# Patient Record
Sex: Female | Born: 1945
Health system: Southern US, Community
[De-identification: ages and names within clinical notes are randomized; demographics above are authoritative.]

## PROBLEM LIST (undated history)

## (undated) DIAGNOSIS — K5790 Diverticulosis of intestine, part unspecified, without perforation or abscess without bleeding: Secondary | ICD-10-CM

## (undated) DIAGNOSIS — R011 Cardiac murmur, unspecified: Secondary | ICD-10-CM

## (undated) DIAGNOSIS — D649 Anemia, unspecified: Secondary | ICD-10-CM

## (undated) DIAGNOSIS — T7840XA Allergy, unspecified, initial encounter: Secondary | ICD-10-CM

## (undated) DIAGNOSIS — H409 Unspecified glaucoma: Secondary | ICD-10-CM

## (undated) DIAGNOSIS — K635 Polyp of colon: Secondary | ICD-10-CM

## (undated) DIAGNOSIS — I10 Essential (primary) hypertension: Secondary | ICD-10-CM

## (undated) DIAGNOSIS — E785 Hyperlipidemia, unspecified: Secondary | ICD-10-CM

## (undated) HISTORY — DX: Cardiac murmur, unspecified: R01.1

## (undated) HISTORY — DX: Polyp of colon: K63.5

## (undated) HISTORY — DX: Essential (primary) hypertension: I10

## (undated) HISTORY — DX: Unspecified glaucoma: H40.9

## (undated) HISTORY — DX: Allergy, unspecified, initial encounter: T78.40XA

## (undated) HISTORY — DX: Diverticulosis of intestine, part unspecified, without perforation or abscess without bleeding: K57.90

## (undated) HISTORY — DX: Anemia, unspecified: D64.9

## (undated) HISTORY — PX: BREAST CYST ASPIRATION: SHX578

## (undated) HISTORY — DX: Hyperlipidemia, unspecified: E78.5

---

## 1999-01-06 ENCOUNTER — Other Ambulatory Visit: Admission: RE | Admit: 1999-01-06 | Discharge: 1999-01-06 | Payer: Self-pay

## 2000-03-28 ENCOUNTER — Other Ambulatory Visit: Admission: RE | Admit: 2000-03-28 | Discharge: 2000-03-28 | Payer: Self-pay | Admitting: Family Medicine

## 2001-04-24 ENCOUNTER — Other Ambulatory Visit: Admission: RE | Admit: 2001-04-24 | Discharge: 2001-04-24 | Payer: Self-pay | Admitting: Family Medicine

## 2001-08-03 ENCOUNTER — Ambulatory Visit (HOSPITAL_COMMUNITY): Admission: RE | Admit: 2001-08-03 | Discharge: 2001-08-03 | Payer: Self-pay | Admitting: Family Medicine

## 2001-08-03 ENCOUNTER — Encounter (INDEPENDENT_AMBULATORY_CARE_PROVIDER_SITE_OTHER): Payer: Self-pay | Admitting: Specialist

## 2002-05-21 ENCOUNTER — Other Ambulatory Visit: Admission: RE | Admit: 2002-05-21 | Discharge: 2002-05-21 | Payer: Self-pay | Admitting: Family Medicine

## 2003-05-29 ENCOUNTER — Other Ambulatory Visit: Admission: RE | Admit: 2003-05-29 | Discharge: 2003-05-29 | Payer: Self-pay | Admitting: Family Medicine

## 2004-04-23 ENCOUNTER — Other Ambulatory Visit: Admission: RE | Admit: 2004-04-23 | Discharge: 2004-04-23 | Payer: Self-pay | Admitting: Orthopaedic Surgery

## 2004-08-24 ENCOUNTER — Other Ambulatory Visit: Admission: RE | Admit: 2004-08-24 | Discharge: 2004-08-24 | Payer: Self-pay | Admitting: *Deleted

## 2005-08-15 ENCOUNTER — Ambulatory Visit (HOSPITAL_COMMUNITY): Admission: RE | Admit: 2005-08-15 | Discharge: 2005-08-15 | Payer: Self-pay | Admitting: Gastroenterology

## 2006-02-07 ENCOUNTER — Other Ambulatory Visit: Admission: RE | Admit: 2006-02-07 | Discharge: 2006-02-07 | Payer: Self-pay | Admitting: Family Medicine

## 2009-01-14 ENCOUNTER — Ambulatory Visit: Payer: Self-pay | Admitting: Cardiovascular Disease

## 2009-01-14 ENCOUNTER — Encounter: Payer: Self-pay | Admitting: Cardiovascular Disease

## 2009-01-14 DIAGNOSIS — I10 Essential (primary) hypertension: Secondary | ICD-10-CM | POA: Insufficient documentation

## 2009-01-14 DIAGNOSIS — R079 Chest pain, unspecified: Secondary | ICD-10-CM | POA: Insufficient documentation

## 2009-01-14 DIAGNOSIS — R011 Cardiac murmur, unspecified: Secondary | ICD-10-CM | POA: Insufficient documentation

## 2009-01-14 DIAGNOSIS — R9431 Abnormal electrocardiogram [ECG] [EKG]: Secondary | ICD-10-CM | POA: Insufficient documentation

## 2009-01-14 DIAGNOSIS — E782 Mixed hyperlipidemia: Secondary | ICD-10-CM | POA: Insufficient documentation

## 2009-02-02 ENCOUNTER — Telehealth (INDEPENDENT_AMBULATORY_CARE_PROVIDER_SITE_OTHER): Payer: Self-pay

## 2009-02-03 ENCOUNTER — Encounter: Payer: Self-pay | Admitting: Cardiovascular Disease

## 2009-02-03 ENCOUNTER — Encounter: Payer: Self-pay | Admitting: Cardiology

## 2009-02-03 ENCOUNTER — Ambulatory Visit: Payer: Self-pay

## 2009-02-13 ENCOUNTER — Telehealth: Payer: Self-pay | Admitting: Cardiology

## 2011-03-11 NOTE — Procedures (Signed)
[facility]  Patient:    Ariana Dillon, Ariana Dillon Visit Number: [id number] MRN: [id number]          Service Type: END Location: ENDO Attending Physician:  Bosie Clos Proc. Date: 08/03/01 Admit Date:  08/03/2001   CC:         Montey Hora, M.D.   Procedure Report  PROCEDURE:  Esophagogastroduodenoscopy.  INDICATION FOR PROCEDURE:  Gastroesophageal reflux symptoms not adequately responding to proton pump inhibitor.  DESCRIPTION OF PROCEDURE:  The patient was placed in the left lateral decubitus position and placed on the pulse monitor with continuous low-flow oxygen delivered by nasal cannula.  She was sedated with 70 mg IV Demerol and 6 mg IV Versed.  The Olympus video endoscope was advanced under direct vision into the oropharynx and esophagus.  The esophagus was straight and of normal caliber with the squamocolumnar line sharply outlined at 38 cm.  There was no visible hiatal hernia, ring, stricture or other abnormality of the GE junction.  The stomach was entered, and a small amount of liquid secretions were suctioned from the fundus.  Retroflexed view of the cardia was unremarkable.  The fundus, body, antrum, and pylorus all appeared normal.  The duodenum was entered, and both the bulb and second portion were well-inspected and appeared to be within normal limits.  The scope was then withdrawn, and the patient returned to the recovery room in stable condition.  She tolerated the procedure well, and there were no immediate complications.  IMPRESSION:  Essentially normal endoscopy.  PLAN:  Reinforce dietary and lifestyle modifications for reflux and continue proton pump inhibitor.  We will proceed with screening colonoscopy as scheduled. Attending Physician:  Bosie Clos DD:  08/03/01 TD:  08/04/01 Job: [id number]

## 2011-03-11 NOTE — Op Note (Signed)
Ariana Dillon, WESTER NO.:  [id number]   MEDICAL RECORD NO.:  [id number]          PATIENT TYPE:  AMB   LOCATION:  ENDO                         FACILITY:  [facility]   PHYSICIAN:  John C. Madilyn Fireman, M.D.    DATE OF BIRTH:  1946/06/26   DATE OF PROCEDURE:  08/15/2005  DATE OF DISCHARGE:                                 OPERATIVE REPORT   PROCEDURE:  Colonoscopy.   INDICATIONS FOR PROCEDURE:  History of adenomatous colon polyps 4 years ago.   PROCEDURE:  The patient was placed in the left lateral decubitus position  and placed on the pulse monitor with continuous low-flow oxygen delivered by  nasal cannula.  She was sedated with 100 mcg IV fentanyl and 90 mg IV  Versed.  The Olympus video colonoscope was inserted into the rectum and  advanced the cecum, confirmed by transillumination of McBurney's point and  visualization of the ileocecal valve and appendiceal orifice.  The prep was  excellent.  The cecum, ascending, and transverse colon all appeared normal  with no masses, polyps, diverticula, or other mucosal abnormalities.  Within  the descending and sigmoid colon, there were seen numerous scattered  diverticuli.  No other abnormalities.  The scope was then withdrawn, and the  patient returned to the recovery room in stable condition.  She tolerated  the procedure well, and there were no immediate complications.   IMPRESSION:  1.  Diverticulosis.  2.  Otherwise normal study.   PLAN:  Repeat colonoscopy in 5 years.           ______________________________  Everardo All Madilyn Fireman, M.D.     [facility]MED[facility]  D:  08/15/2005  T:  08/15/2005  Job:  [id number]   cc:   Magnus Sinning. Rice, M.D.  Fax: [phone]

## 2011-03-11 NOTE — Procedures (Signed)
[facility]  Patient:    Ariana Dillon, Ariana Dillon Visit Number: [id number] MRN: [id number]          Service Type: END Location: ENDO Attending Physician:  Bosie Clos Proc. Date: 08/03/01 Admit Date:  08/03/2001   CC:         Montey Hora, M.D.   Procedure Report  PROCEDURE:  Colonoscopy.  INDICATION FOR PROCEDURE:  Screening colonoscopy in a patient following esophagogastroduodenoscopy for refractory reflux symptoms.  DESCRIPTION OF PROCEDURE:  The patient was placed in the left lateral decubitus position and placed on the pulse monitor with continuous low-flow oxygen delivered by nasal cannula.  She was sedated with 20 mg IV Demerol and 1.5 mg IV Versed in addition to the medicine given for the previous EGD.  The Olympus video colonoscope was inserted into the rectum and advanced to the cecum, confirmed by transillumination at McBurneys point and visualization of the ileocecal valve and appendiceal orifice.  The prep was excellent.  The cecum revealed a 4 mm polyp and a 7 mm polyp, both of which were fulgurated by hot biopsy.  The remainder of the cecum, ascending, and transverse colon, appeared normal with no further polyps, masses, diverticula, or other mucosal abnormalities.  Within the sigmoid colon, there were seen several diverticula and no other abnormalities.  The rectum appeared normal, and retroflex view of the anus no obvious internal hemorrhoids.  The colonoscope was then withdrawn and the patient returned to the recovery room in stable condition.  She tolerated the procedure well, and there were no immediate complications.  IMPRESSION: 1. Small cecal polyps. 2. Significant diverticulosis. Attending Physician:  Bosie Clos DD:  08/03/01 TD:  08/04/01 Job: [id number]

## 2013-04-30 ENCOUNTER — Ambulatory Visit (INDEPENDENT_AMBULATORY_CARE_PROVIDER_SITE_OTHER): Payer: PRIVATE HEALTH INSURANCE | Admitting: Family Medicine

## 2013-04-30 VITALS — BP 154/67 | HR 76 | Temp 98.4°F | Ht 65.0 in | Wt 184.6 lb

## 2013-04-30 DIAGNOSIS — W57XXXA Bitten or stung by nonvenomous insect and other nonvenomous arthropods, initial encounter: Secondary | ICD-10-CM

## 2013-04-30 DIAGNOSIS — T148 Other injury of unspecified body region: Secondary | ICD-10-CM

## 2013-04-30 MED ORDER — DOXYCYCLINE HYCLATE 100 MG PO TABS: 100.0000 mg | ORAL_TABLET | Freq: Two times a day (BID) | ORAL | Status: DC

## 2013-04-30 NOTE — Progress Notes (Signed)
  Subjective:    Patient ID: Ariana Dillon, female    DOB: 19-Feb-1946, 67 y.o.   MRN: [id number]  HPI  This 67 y.o. female presents for evaluation of tick bite and rash. Patient has rash on her right inner thigh.  She has had this for a couple days..  Review of Systems    C/o rash and tick bite.  No chest pain, SOB, HA, dizziness, vision change, N/V, diarrhea, constipation, dysuria, urinary urgency or frequency, myalgias, arthralgias.  Objective:   Physical Exam  Vital signs noted  Well developed well nourished female.  HEENT - Head atraumatic Normocephalic             Respiratory - Lungs CTA bilateral Cardiac - RRR S1 and S2 without murmur Neuro - Grossly intact.      Assessment & Plan:  Tick bite - Plan: doxycycline (VIBRA-TABS) 100 MG tablet,  Tylenol and motrin otc prn and follow up if not better.

## 2013-04-30 NOTE — Patient Instructions (Signed)
Deer Tick Bite Deer ticks are brown arachnids (spider family) that vary in size from as small as the head of a pin to 1/4 inch (1/2 cm) diameter. They thrive in wooded areas. Deer are the preferred host of adult deer ticks. Small rodents are the host of young ticks (nymphs). When a person walks in a field or wooded area, young and adult ticks in the surrounding grass and vegetation can attach themselves to the skin. They can suck blood for hours to days if unnoticed. Ticks are found all over the [place] Some ticks carry a specific bacteria (Borrelia burgdorferi) that causes an infection called Lyme disease. The bacteria is typically passed into a person during the blood sucking process. This happens after the tick has been attached for at least a number of hours. While ticks can be found all over the [place] those carrying the bacteria that causes Lyme disease are most common in [place] and the [place]. Only a small proportion of ticks in these areas carry the Lyme disease bacteria and cause human infections. Ticks usually attach to warm spots on the body, such as the:  Head.  Back.  Neck.  Armpits.  Groin. SYMPTOMS  Most of the time, a deer tick bite will not be felt. You may or may not see the attached tick. You may notice mild irritation or redness around the bite site. If the deer tick passes the Lyme disease bacteria to a person, a round, red rash may be noticed 2 to 3 days after the bite. The rash may be clear in the middle, like a bull's-eye or target. If not treated, other symptoms may develop several days to weeks after the onset of the rash. These symptoms may include:  New rash lesions.  Fatigue and weakness.  General ill feeling and achiness.  Chills.  Headache and neck pain.  Swollen lymph glands.  Sore muscles and joints. 5 to 15% of untreated people with Lyme disease may develop more severe illnesses after several weeks to months. This may include inflammation of the  brain lining (meningitis), nerve palsies, an abnormal heartbeat, or severe muscle and joint pain and inflammation (myositis or arthritis). DIAGNOSIS   Physical exam and medical history.  Viewing the tick if it was saved for confirmation.  Blood tests (to check or confirm the presence of Lyme disease). TREATMENT  Most ticks do not carry disease. If found, an attached tick should be removed using tweezers. Tweezers should be placed under the body of the tick so it is removed by its attachment parts (pincers). If there are signs or symptoms of being sick, or Lyme disease is confirmed, medicines (antibiotics) that kill germs are usually prescribed. In more severe cases, antibiotics may be given through an intravenous (IV) access. HOME CARE INSTRUCTIONS   Always remove ticks with tweezers. Do not use petroleum jelly or other methods to kill or remove the tick. Slide the tweezers under the body and pull out as much as you can. If you are not sure what it is, save it in a jar and show your caregiver.  Once you remove the tick, the skin will heal on its own. Wash your hands and the affected area with water and soap. You may place a bandage on the affected area.  Take medicine as directed. You may be advised to take a full course of antibiotics.  Follow up with your caregiver as recommended. FINDING OUT THE RESULTS OF YOUR TEST Not all test results are available   during your visit. If your test results are not back during the visit, make an appointment with your caregiver to find out the results. Do not assume everything is normal if you have not heard from your caregiver or the medical facility. It is important for you to follow up on all of your test results. PROGNOSIS  If Lyme disease is confirmed, early treatment with antibiotics is very effective. Following preventive guidelines is important since it is possible to get the disease more than once. PREVENTION   Wear long sleeves and long pants in  wooded or grassy areas. Tuck your pants into your socks.  Use an insect repellent while hiking.  Check yourself, your children, and your pets regularly for ticks after playing outside.  Clear piles of leaves or brush from your yard. Ticks might live there. SEEK MEDICAL CARE IF:   You or your child has an oral temperature above 102 F (38.9 C).  You develop a severe headache following the bite.  You feel generally ill.  You notice a rash.  You are having trouble removing the tick.  The bite area has red skin or yellow drainage. SEEK IMMEDIATE MEDICAL CARE IF:   Your face is weak and droopy or you have other neurological symptoms.  You have severe joint pain or weakness. MAKE SURE YOU:   Understand these instructions.  Will watch your condition.  Will get help right away if you are not doing well or get worse. FOR MORE INFORMATION Centers for Disease Control and Prevention: www.cdc.gov American Academy [employer]icians: www.aafp.org Document Released: 01/04/2010 Document Revised: 01/02/2012 Document Reviewed: 01/04/2010 [employer] Patient Information 2014 [employer].  

## 2013-05-24 ENCOUNTER — Other Ambulatory Visit: Payer: Self-pay | Admitting: Nurse Practitioner

## 2013-07-30 ENCOUNTER — Ambulatory Visit (INDEPENDENT_AMBULATORY_CARE_PROVIDER_SITE_OTHER): Payer: PRIVATE HEALTH INSURANCE | Admitting: Family Medicine

## 2013-07-30 ENCOUNTER — Encounter: Payer: Self-pay | Admitting: Family Medicine

## 2013-07-30 VITALS — BP 140/82 | HR 78 | Temp 97.8°F | Ht 65.0 in | Wt 180.0 lb

## 2013-07-30 DIAGNOSIS — J069 Acute upper respiratory infection, unspecified: Secondary | ICD-10-CM

## 2013-07-30 DIAGNOSIS — J40 Bronchitis, not specified as acute or chronic: Secondary | ICD-10-CM

## 2013-07-30 MED ORDER — AZITHROMYCIN 250 MG PO TABS: ORAL_TABLET | ORAL | Status: DC

## 2013-07-30 NOTE — Progress Notes (Signed)
  Subjective:    Patient ID: Ariana Dillon, female    DOB: October 05, 1946, 67 y.o.   MRN: [id number]  HPI URI Symptoms Onset: 3-4 weeks  Description: rhinorrhea, nasal congestion, cough  Modifying factors:  none  Symptoms Nasal discharge: yes Fever: no Sore throat: no Cough: yes Wheezing: no Ear pain: no GI symptoms: no Sick contacts: unsure   Red Flags  Stiff neck: no Dyspnea: no Rash: no Swallowing difficulty: no  Sinusitis Risk Factors Headache/face pain: no Double sickening: no tooth pain: no  Allergy Risk Factors Sneezing: yes Itchy scratchy throat: mild Seasonal symptoms: no  Flu Risk Factors Headache: no muscle aches: n severe fatigue: no     Review of Systems  All other systems reviewed and are negative.       Objective:   Physical Exam  Constitutional: She appears well-developed and well-nourished.  HENT:  Head: Normocephalic and atraumatic.  Right Ear: External ear normal.  Left Ear: External ear normal.  +nasal erythema, rhinorrhea bilaterally, + post oropharyngeal erythema    Eyes: Conjunctivae are normal. Pupils are equal, round, and reactive to light.  Neck: Normal range of motion.  Cardiovascular: Normal rate and regular rhythm.   Pulmonary/Chest: Effort normal and breath sounds normal. She has no wheezes.  Abdominal: Soft.  Musculoskeletal: Normal range of motion.  Neurological: She is alert.  Skin: Skin is warm.          Assessment & Plan:  URI (upper respiratory infection) - Plan: azithromycin (ZITHROMAX) 250 MG tablet  Bronchitis - Plan: azithromycin (ZITHROMAX) 250 MG tablet  Likely overlapping viral and allergic processes.  WIll place on zpak for atypical coverage given duration of sxs.  Consider starting anthistamine Discussed general and infectious/ENT red flags.  Follow up as needed.

## 2013-10-05 ENCOUNTER — Other Ambulatory Visit: Payer: Self-pay | Admitting: Family Medicine

## 2013-10-09 ENCOUNTER — Ambulatory Visit (INDEPENDENT_AMBULATORY_CARE_PROVIDER_SITE_OTHER): Payer: PRIVATE HEALTH INSURANCE | Admitting: Nurse Practitioner

## 2013-10-09 ENCOUNTER — Encounter: Payer: Self-pay | Admitting: Nurse Practitioner

## 2013-10-09 VITALS — BP 122/71 | HR 74 | Temp 98.4°F | Ht 65.0 in | Wt 180.0 lb

## 2013-10-09 DIAGNOSIS — Z23 Encounter for immunization: Secondary | ICD-10-CM

## 2013-10-09 DIAGNOSIS — I10 Essential (primary) hypertension: Secondary | ICD-10-CM

## 2013-10-09 DIAGNOSIS — E782 Mixed hyperlipidemia: Secondary | ICD-10-CM

## 2013-10-09 MED ORDER — MOEXIPRIL-HYDROCHLOROTHIAZIDE 15-25 MG PO TABS: 1.0000 | ORAL_TABLET | Freq: Every day | ORAL | Status: DC

## 2013-10-09 NOTE — Patient Instructions (Signed)

## 2013-10-09 NOTE — Progress Notes (Signed)
Subjective:    Patient ID: Ariana Dillon, female    DOB: Oct 05, 1946, 67 y.o.   MRN: [id number]  Patient in today for follow up of chronic medical problems.- she is doing well without complaints.  Hypertension This is a chronic problem. The current episode started more than 1 year ago. The problem is unchanged. The problem is controlled. Pertinent negatives include no blurred vision, chest pain, headaches, malaise/fatigue, neck pain, palpitations, peripheral edema or shortness of breath. There are no associated agents to hypertension. Risk factors for coronary artery disease include dyslipidemia, family history and post-menopausal state. Past treatments include ACE inhibitors and diuretics. The current treatment provides significant improvement. There are no compliance problems.   Hyperlipidemia This is a chronic problem. The current episode started more than 1 year ago. The problem is uncontrolled. Recent lipid tests were reviewed and are high. Exacerbating diseases include obesity. She has no history of diabetes or hypothyroidism. There are no known factors aggravating her hyperlipidemia. Pertinent negatives include no chest pain or shortness of breath. She is currently on no antihyperlipidemic treatment. The current treatment provides no improvement of lipids. Compliance problems include adherence to diet and adherence to exercise.  Risk factors for coronary artery disease include dyslipidemia, hypertension and post-menopausal.      Review of Systems  Constitutional: Negative for malaise/fatigue.  Eyes: Negative for blurred vision.  Respiratory: Negative for shortness of breath.   Cardiovascular: Negative for chest pain and palpitations.  Musculoskeletal: Negative for neck pain.  Neurological: Negative for headaches.  All other systems reviewed and are negative.       Objective:   Physical Exam  Constitutional: She is oriented to person, place, and time. She appears well-developed and  well-nourished.  HENT:  Nose: Nose normal.  Mouth/Throat: Oropharynx is clear and moist.  Eyes: EOM are normal.  Neck: Trachea normal, normal range of motion and full passive range of motion without pain. Neck supple. No JVD present. Carotid bruit is not present. No thyromegaly present.  Cardiovascular: Normal rate, regular rhythm, normal heart sounds and intact distal pulses.  Exam reveals no gallop and no friction rub.   No murmur heard. Pulmonary/Chest: Effort normal and breath sounds normal.  Abdominal: Soft. Bowel sounds are normal. She exhibits no distension and no mass. There is no tenderness.  Musculoskeletal: Normal range of motion.  Lymphadenopathy:    She has no cervical adenopathy.  Neurological: She is alert and oriented to person, place, and time. She has normal reflexes.  Skin: Skin is warm and dry.  Psychiatric: She has a normal mood and affect. Her behavior is normal. Judgment and thought content normal.   BP 122/71  Pulse 74  Temp(Src) 98.4 F (36.9 C) (Oral)  Ht 5\' 5"  (1.651 m)  Wt 180 lb (81.647 kg)  BMI 29.95 kg/m2        Assessment & Plan:   1. Essential hypertension, benign   2. Mixed hyperlipidemia    Orders Placed This Encounter  Procedures  . CMP14+EGFR  . NMR, lipoprofile   Meds ordered this encounter  Medications  . moexipril-hydrochlorothiazide (UNIRETIC) 15-25 MG per tablet    Sig: Take 1 tablet by mouth daily.    Dispense:  90 tablet    Refill:  1    Order Specific Question:  Supervising Provider    Answer:  Deborra Medina    Continue all meds Labs pending Diet and exercise encouraged Health maintenance reviewed Follow up in 3 months Hemoccult cards given  Mary-Margaret Hassell Done, FNP

## 2013-10-11 LAB — CMP14+EGFR
Albumin/Globulin Ratio: 1.6 (ref 1.1–2.5)
Albumin: 4.4 g/dL (ref 3.6–4.8)
BUN/Creatinine Ratio: 16 (ref 11–26)
BUN: 13 mg/dL (ref 8–27)
Chloride: 100 mmol/L (ref 97–108)
Creatinine, Ser: 0.8 mg/dL (ref 0.57–1.00)
GFR calc Af Amer: 88 mL/min/{1.73_m2} (ref >59)
GFR calc non Af Amer: 77 mL/min/{1.73_m2} (ref >59)
Globulin, Total: 2.7 g/dL (ref 1.5–4.5)
Glucose: 98 mg/dL (ref 65–99)
Total Bilirubin: 0.4 mg/dL (ref 0.0–1.2)
Total Protein: 7.1 g/dL (ref 6.0–8.5)

## 2013-10-11 LAB — NMR, LIPOPROFILE
HDL Cholesterol by NMR: 49 mg/dL (ref >=40)
LDL Particle Number: 1557 nmol/L — ABNORMAL HIGH (ref <1000)
LDLC SERPL CALC-MCNC: 118 mg/dL — ABNORMAL HIGH (ref <100)
LP-IR Score: 60 — ABNORMAL HIGH (ref <=45)
Small LDL Particle Number: 791 nmol/L — ABNORMAL HIGH (ref <=527)
Triglycerides by NMR: 92 mg/dL (ref <150)

## 2013-10-14 ENCOUNTER — Encounter: Payer: Self-pay | Admitting: Family Medicine

## 2013-10-15 ENCOUNTER — Other Ambulatory Visit: Payer: Self-pay | Admitting: Nurse Practitioner

## 2013-10-15 MED ORDER — ROSUVASTATIN CALCIUM 10 MG PO TABS: 10.0000 mg | ORAL_TABLET | Freq: Every day | ORAL | Status: DC

## 2013-10-23 ENCOUNTER — Other Ambulatory Visit (INDEPENDENT_AMBULATORY_CARE_PROVIDER_SITE_OTHER): Payer: PRIVATE HEALTH INSURANCE

## 2013-10-23 DIAGNOSIS — Z1212 Encounter for screening for malignant neoplasm of rectum: Secondary | ICD-10-CM

## 2013-10-23 NOTE — Progress Notes (Signed)
Patient dropped off fobt 

## 2013-10-24 LAB — FECAL OCCULT BLOOD, IMMUNOCHEMICAL: Fecal Occult Bld: NEGATIVE

## 2014-01-08 ENCOUNTER — Telehealth: Payer: Self-pay | Admitting: Nurse Practitioner

## 2014-01-08 MED ORDER — LISINOPRIL-HYDROCHLOROTHIAZIDE 20-12.5 MG PO TABS: 1.0000 | ORAL_TABLET | Freq: Every day | ORAL | Status: DC

## 2014-01-08 NOTE — Telephone Encounter (Signed)
rx sent to pharmacy Changed to lisinopril/hctz-20/12.5 daily- if does well we will just keep you on this med instead of other

## 2014-01-08 NOTE — Telephone Encounter (Signed)
Patient notified. She will let us know how she does on this new medicine.

## 2014-02-03 ENCOUNTER — Other Ambulatory Visit: Payer: PRIVATE HEALTH INSURANCE

## 2014-02-04 ENCOUNTER — Other Ambulatory Visit: Payer: PRIVATE HEALTH INSURANCE

## 2014-02-04 DIAGNOSIS — E785 Hyperlipidemia, unspecified: Secondary | ICD-10-CM

## 2014-02-05 LAB — CMP14+EGFR
ALK PHOS: 101 IU/L (ref 39–117)
ALT: 11 IU/L (ref 0–32)
AST: 15 IU/L (ref 0–40)
Albumin/Globulin Ratio: 1.5 (ref 1.1–2.5)
Albumin: 4.1 g/dL (ref 3.6–4.8)
BILIRUBIN TOTAL: 0.4 mg/dL (ref 0.0–1.2)
BUN / CREAT RATIO: 23 (ref 11–26)
BUN: 20 mg/dL (ref 8–27)
CHLORIDE: 103 mmol/L (ref 97–108)
CO2: 26 mmol/L (ref 18–29)
Calcium: 9.5 mg/dL (ref 8.7–10.3)
Creatinine, Ser: 0.87 mg/dL (ref 0.57–1.00)
GFR calc Af Amer: 80 mL/min/{1.73_m2} (ref >59)
GFR calc non Af Amer: 69 mL/min/{1.73_m2} (ref >59)
Globulin, Total: 2.7 g/dL (ref 1.5–4.5)
Glucose: 96 mg/dL (ref 65–99)
Potassium: 4.4 mmol/L (ref 3.5–5.2)
Sodium: 140 mmol/L (ref 134–144)
Total Protein: 6.8 g/dL (ref 6.0–8.5)

## 2014-02-05 LAB — NMR, LIPOPROFILE
CHOLESTEROL: 166 mg/dL (ref <200)
HDL Cholesterol by NMR: 47 mg/dL (ref >=40)
HDL Particle Number: 32.4 umol/L (ref >=30.5)
LDL PARTICLE NUMBER: 1372 nmol/L — AB (ref <1000)
LDL SIZE: 21.2 nm (ref >20.5)
LDLC SERPL CALC-MCNC: 99 mg/dL (ref <100)
LP-IR SCORE: 60 — AB (ref <=45)
Small LDL Particle Number: 511 nmol/L (ref <=527)
Triglycerides by NMR: 99 mg/dL (ref <150)

## 2014-02-06 ENCOUNTER — Encounter: Payer: Self-pay | Admitting: Nurse Practitioner

## 2014-06-08 ENCOUNTER — Other Ambulatory Visit: Payer: Self-pay | Admitting: Nurse Practitioner

## 2014-06-09 NOTE — Telephone Encounter (Signed)
Last ov 12/14. 

## 2014-06-09 NOTE — Telephone Encounter (Signed)
no more refills without being seen  

## 2014-09-06 ENCOUNTER — Other Ambulatory Visit: Payer: Self-pay | Admitting: Nurse Practitioner

## 2014-10-27 ENCOUNTER — Ambulatory Visit (INDEPENDENT_AMBULATORY_CARE_PROVIDER_SITE_OTHER): Payer: PRIVATE HEALTH INSURANCE | Admitting: Nurse Practitioner

## 2014-10-27 ENCOUNTER — Ambulatory Visit (INDEPENDENT_AMBULATORY_CARE_PROVIDER_SITE_OTHER): Payer: PRIVATE HEALTH INSURANCE

## 2014-10-27 ENCOUNTER — Encounter: Payer: Self-pay | Admitting: Nurse Practitioner

## 2014-10-27 VITALS — BP 136/78 | HR 66 | Temp 97.1°F | Ht 65.0 in | Wt 178.0 lb

## 2014-10-27 DIAGNOSIS — E782 Mixed hyperlipidemia: Secondary | ICD-10-CM

## 2014-10-27 DIAGNOSIS — I10 Essential (primary) hypertension: Secondary | ICD-10-CM

## 2014-10-27 DIAGNOSIS — Z0000 Encounter for general adult medical examination without abnormal findings: Secondary | ICD-10-CM

## 2014-10-27 DIAGNOSIS — Z01419 Encounter for gynecological examination (general) (routine) without abnormal findings: Secondary | ICD-10-CM

## 2014-10-27 LAB — POCT CBC
Granulocyte percent: 64.8 %G (ref 37–80)
HEMATOCRIT: 43.6 % (ref 37.7–47.9)
HEMOGLOBIN: 13.4 g/dL (ref 12.2–16.2)
Lymph, poc: 1.9 (ref 0.6–3.4)
MCH, POC: 27.6 pg (ref 27–31.2)
MCHC: 30.7 g/dL — AB (ref 31.8–35.4)
MCV: 89.8 fL (ref 80–97)
MPV: 7.5 fL (ref 0–99.8)
POC GRANULOCYTE: 4.6 (ref 2–6.9)
POC LYMPH PERCENT: 27.1 %L (ref 10–50)
Platelet Count, POC: 211 10*3/uL (ref 142–424)
RBC: 4.9 M/uL (ref 4.04–5.48)
RDW, POC: 12.8 %
WBC: 7.1 10*3/uL (ref 4.6–10.2)

## 2014-10-27 LAB — POCT UA - MICROSCOPIC ONLY
CRYSTALS, UR, HPF, POC: NEGATIVE
Casts, Ur, LPF, POC: NEGATIVE
Mucus, UA: NEGATIVE
Yeast, UA: NEGATIVE

## 2014-10-27 LAB — POCT URINALYSIS DIPSTICK
Bilirubin, UA: NEGATIVE
Blood, UA: NEGATIVE
Glucose, UA: NEGATIVE
KETONES UA: NEGATIVE
Nitrite, UA: NEGATIVE
PH UA: 6
PROTEIN UA: NEGATIVE
Spec Grav, UA: 1.015
Urobilinogen, UA: NEGATIVE

## 2014-10-27 MED ORDER — LISINOPRIL-HYDROCHLOROTHIAZIDE 20-12.5 MG PO TABS: 1.0000 | ORAL_TABLET | Freq: Every day | ORAL | Status: DC

## 2014-10-27 NOTE — Progress Notes (Signed)
   Subjective:    Patient ID: Ariana Dillon, female    DOB: 02-11-1946, 69 y.o.   MRN: [id number]  Patient in today for annual physical exam and follow up of chronic medical problems.- she is doing well without complaints.  Hypertension This is a chronic problem. The current episode started more than 1 year ago. The problem is controlled. Pertinent negatives include no chest pain, headaches, neck pain, palpitations or shortness of breath. Risk factors for coronary artery disease include dyslipidemia, obesity and post-menopausal state. Past treatments include diuretics and ACE inhibitors. The current treatment provides moderate improvement. Compliance problems include diet and exercise.   Hyperlipidemia This is a chronic problem. The current episode started more than 1 year ago. The problem is uncontrolled. Recent lipid tests were reviewed and are high. Exacerbating diseases include obesity. She has no history of diabetes or hypothyroidism. Pertinent negatives include no chest pain or shortness of breath. She is currently on no antihyperlipidemic treatment. The current treatment provides moderate improvement of lipids. Compliance problems include adherence to diet and adherence to exercise.  Risk factors for coronary artery disease include dyslipidemia and hypertension.      Review of Systems  Respiratory: Negative for shortness of breath.   Cardiovascular: Negative for chest pain and palpitations.  Musculoskeletal: Negative for neck pain.  Neurological: Negative for headaches.  All other systems reviewed and are negative.      Objective:   Physical Exam  Constitutional: She is oriented to person, place, and time. She appears well-developed and well-nourished.  HENT:  Nose: Nose normal.  Mouth/Throat: Oropharynx is clear and moist.  Eyes: EOM are normal.  Neck: Trachea normal, normal range of motion and full passive range of motion without pain. Neck supple. No JVD present. Carotid bruit is  not present. No thyromegaly present.  Cardiovascular: Normal rate, regular rhythm, normal heart sounds and intact distal pulses.  Exam reveals no gallop and no friction rub.   No murmur heard. Pulmonary/Chest: Effort normal and breath sounds normal.  Abdominal: Soft. Bowel sounds are normal. She exhibits no distension and no mass. There is no tenderness.  Musculoskeletal: Normal range of motion.  Lymphadenopathy:    She has no cervical adenopathy.  Neurological: She is alert and oriented to person, place, and time. She has normal reflexes.  Skin: Skin is warm and dry.  Psychiatric: She has a normal mood and affect. Her behavior is normal. Judgment and thought content normal.   BP 136/78 mmHg  Pulse 66  Temp(Src) 97.1 F (36.2 C) (Oral)  Ht _0  (1.651 m)  Wt 178 lb (80.74 kg)  BMI 29.62 kg/m2  EKG- NSR-Mary-Margaret Hassell Done, FNP   Chest x ray- no acute or chronic findings-Preliminary reading by Ronnald Collum, FNP  [facility]         Assessment & Plan:   1. Annual physical exam - POCT urinalysis dipstick - POCT UA - Microscopic Only - DG Chest 2 View; Future  2. Essential hypertension, benign Do not add salt to diet - EKG 12-Lead - CMP14+EGFR  3. Mixed hyperlipidemia Watch fats in diet - NMR, lipoprofile    Labs pending Health maintenance reviewed Diet and exercise encouraged Continue all meds Follow up  In 6 months   [place], FNP

## 2014-10-27 NOTE — Patient Instructions (Signed)

## 2014-10-28 LAB — CMP14+EGFR
A/G RATIO: 1.6 (ref 1.1–2.5)
ALK PHOS: 102 IU/L (ref 39–117)
ALT: 10 IU/L (ref 0–32)
AST: 12 IU/L (ref 0–40)
Albumin: 4.4 g/dL (ref 3.6–4.8)
BILIRUBIN TOTAL: 0.5 mg/dL (ref 0.0–1.2)
BUN / CREAT RATIO: 18 (ref 11–26)
BUN: 15 mg/dL (ref 8–27)
CO2: 27 mmol/L (ref 18–29)
Calcium: 10 mg/dL (ref 8.7–10.3)
Chloride: 102 mmol/L (ref 97–108)
Creatinine, Ser: 0.82 mg/dL (ref 0.57–1.00)
GFR, EST AFRICAN AMERICAN: 85 mL/min/{1.73_m2} (ref >59)
GFR, EST NON AFRICAN AMERICAN: 74 mL/min/{1.73_m2} (ref >59)
GLOBULIN, TOTAL: 2.7 g/dL (ref 1.5–4.5)
Glucose: 101 mg/dL — ABNORMAL HIGH (ref 65–99)
POTASSIUM: 3.7 mmol/L (ref 3.5–5.2)
Sodium: 142 mmol/L (ref 134–144)
Total Protein: 7.1 g/dL (ref 6.0–8.5)

## 2014-10-28 LAB — NMR, LIPOPROFILE
CHOLESTEROL: 196 mg/dL (ref 100–199)
HDL Cholesterol by NMR: 50 mg/dL (ref >39)
HDL PARTICLE NUMBER: 33.9 umol/L (ref >=30.5)
LDL Particle Number: 1415 nmol/L — ABNORMAL HIGH (ref <1000)
LDL Size: 21.1 nm (ref >20.5)
LDL-C: 128 mg/dL — ABNORMAL HIGH (ref 0–99)
LP-IR SCORE: 50 — AB (ref <=45)
SMALL LDL PARTICLE NUMBER: 453 nmol/L (ref <=527)
TRIGLYCERIDES BY NMR: 92 mg/dL (ref 0–149)

## 2014-10-28 LAB — THYROID PANEL WITH TSH
FREE THYROXINE INDEX: 2.7 (ref 1.2–4.9)
T3 Uptake Ratio: 29 % (ref 24–39)
T4, Total: 9.3 ug/dL (ref 4.5–12.0)
TSH: 2.51 u[IU]/mL (ref 0.450–4.500)

## 2014-10-29 ENCOUNTER — Other Ambulatory Visit: Payer: Self-pay | Admitting: Nurse Practitioner

## 2014-10-29 MED ORDER — PRAVASTATIN SODIUM 80 MG PO TABS: 80.0000 mg | ORAL_TABLET | Freq: Every day | ORAL | Status: DC

## 2014-11-04 ENCOUNTER — Other Ambulatory Visit: Payer: PRIVATE HEALTH INSURANCE

## 2014-11-04 DIAGNOSIS — Z1212 Encounter for screening for malignant neoplasm of rectum: Secondary | ICD-10-CM

## 2014-11-04 NOTE — Progress Notes (Signed)
Lab only 

## 2014-11-06 LAB — FECAL OCCULT BLOOD, IMMUNOCHEMICAL: Fecal Occult Bld: NEGATIVE

## 2014-12-05 ENCOUNTER — Other Ambulatory Visit: Payer: Self-pay | Admitting: Nurse Practitioner

## 2015-04-20 ENCOUNTER — Other Ambulatory Visit: Payer: Self-pay

## 2015-04-29 ENCOUNTER — Other Ambulatory Visit: Payer: Self-pay | Admitting: Nurse Practitioner

## 2015-05-01 ENCOUNTER — Ambulatory Visit (INDEPENDENT_AMBULATORY_CARE_PROVIDER_SITE_OTHER): Payer: BLUE CROSS/BLUE SHIELD

## 2015-05-01 ENCOUNTER — Ambulatory Visit (INDEPENDENT_AMBULATORY_CARE_PROVIDER_SITE_OTHER): Payer: BLUE CROSS/BLUE SHIELD | Admitting: Nurse Practitioner

## 2015-05-01 ENCOUNTER — Encounter: Payer: Self-pay | Admitting: Nurse Practitioner

## 2015-05-01 VITALS — BP 138/66 | HR 60 | Temp 97.0°F | Ht 65.0 in | Wt 184.0 lb

## 2015-05-01 DIAGNOSIS — I10 Essential (primary) hypertension: Secondary | ICD-10-CM | POA: Diagnosis not present

## 2015-05-01 DIAGNOSIS — Z6829 Body mass index (BMI) 29.0-29.9, adult: Secondary | ICD-10-CM | POA: Insufficient documentation

## 2015-05-01 DIAGNOSIS — Z6830 Body mass index (BMI) 30.0-30.9, adult: Secondary | ICD-10-CM

## 2015-05-01 DIAGNOSIS — Z23 Encounter for immunization: Secondary | ICD-10-CM | POA: Diagnosis not present

## 2015-05-01 DIAGNOSIS — Z780 Asymptomatic menopausal state: Secondary | ICD-10-CM

## 2015-05-01 DIAGNOSIS — E782 Mixed hyperlipidemia: Secondary | ICD-10-CM

## 2015-05-01 MED ORDER — LISINOPRIL-HYDROCHLOROTHIAZIDE 20-12.5 MG PO TABS: 1.0000 | ORAL_TABLET | Freq: Every day | ORAL | Status: DC

## 2015-05-01 MED ORDER — PRAVASTATIN SODIUM 80 MG PO TABS: ORAL_TABLET | ORAL | Status: DC

## 2015-05-01 NOTE — Addendum Note (Signed)
Addended by: Cleda DaubUCKER, Shirin Echeverry G on: 05/01/2015 09:48 AM   Modules accepted: Orders

## 2015-05-01 NOTE — Addendum Note (Signed)
Addended by: Cleda DaubUCKER, Xsavier Seeley G on: 05/01/2015 11:22 AM   Modules accepted: Orders

## 2015-05-01 NOTE — Patient Instructions (Signed)
Bone Health Our bones do many things. They provide structure, protect organs, anchor muscles, and store calcium. Adequate calcium in your diet and weight-bearing physical activity help build strong bones, improve bone amounts, and may reduce the risk of weakening of bones (osteoporosis) later in life. PEAK BONE MASS By age 69, the average woman has acquired most of her skeletal bone mass. A large decline occurs in older adults which increases the risk of osteoporosis. In women this occurs around the time of menopause. It is important for young girls to reach their peak bone mass in order to maintain bone health throughout life. A person with high bone mass as a young adult will be more likely to have a higher bone mass later in life. Not enough calcium consumption and physical activity early on could result in a failure to achieve optimum bone mass in adulthood. OSTEOPOROSIS Osteoporosis is a disease of the bones. It is defined as low bone mass with deterioration of bone structure. Osteoporosis leads to an increase risk of fractures with falls. These fractures commonly happen in the wrist, hip, and spine. While men and women of all ages and background can develop osteoporosis, some of the risk factors for osteoporosis are:  Female.  White.  Postmenopausal.  Older adults.  Small in body size.  Eating a diet low in calcium.  Physically inactive.  Smoking.  Use of some medications.  Family history. CALCIUM Calcium is a mineral needed by the body for healthy bones, teeth, and proper function of the heart, muscles, and nerves. The body cannot produce calcium so it must be absorbed through food. Good sources of calcium include:  Dairy products (low fat or nonfat milk, cheese, and yogurt).  Dark green leafy vegetables (bok choy and broccoli).  Calcium fortified foods (orange juice, cereal, bread, soy beverages, and tofu products).  Nuts (almonds). Recommended amounts of calcium vary  for individuals. RECOMMENDED CALCIUM INTAKES Age and Amount in mg per day  Children 1 to 3 years / 700 mg  Children 4 to 8 years / 1,000 mg  Children 9 to 13 years / 1,300 mg  Teens 14 to 18 years / 1,300 mg  Adults 19 to 50 years / 1,000 mg  Adult women 51 to 70 years / 1,200 mg  Adults 71 years and older / 1,200 mg  Pregnant and breastfeeding teens / 1,300 mg  Pregnant and breastfeeding adults / 1,000 mg Vitamin D also plays an important role in healthy bone development. Vitamin D helps in the absorption of calcium. WEIGHT-BEARING PHYSICAL ACTIVITY Regular physical activity has many positive health benefits. Benefits include strong bones. Weight-bearing physical activity early in life is important in reaching peak bone mass. Weight-bearing physical activities cause muscles and bones to work against gravity. Some examples of weight bearing physical activities include:  Walking, jogging, or running.  Field Hockey.  Jumping rope.  Dancing.  Soccer.  Tennis or Racquetball.  Stair climbing.  Basketball.  Hiking.  Weight lifting.  Aerobic fitness classes. Including weight-bearing physical activity into an exercise plan is a great way to keep bones healthy. Adults: Engage in at least 30 minutes of moderate physical activity on most, preferably all, days of the week. Children: Engage in at least 60 minutes of moderate physical activity on most, preferably all, days of the week. FOR MORE INFORMATION [place] [employer] Agriculture, Center for Nutrition Policy and Promotion: www.cnpp.usda.gov National Osteoporosis Foundation: www.nof.org Document Released: 12/31/2003 Document Revised: 02/04/2013 Document Reviewed: 04/01/2009 [employer] Patient Information   2015 [employer]. This information is not intended to replace advice given to you by your health care provider. Make sure you discuss any questions you have with your health care provider.  

## 2015-05-01 NOTE — Progress Notes (Signed)
   Subjective:    Patient ID: Ariana Dillon, female    DOB: 1946/01/18, 69 y.o.   MRN: [id number]  Patient in today for annual physical exam and follow up of chronic medical problems.- she is doing well without complaints.  Hypertension This is a chronic problem. The current episode started more than 1 year ago. The problem is controlled. Pertinent negatives include no chest pain, headaches, neck pain, palpitations or shortness of breath. Risk factors for coronary artery disease include dyslipidemia, obesity and post-menopausal state. Past treatments include diuretics and ACE inhibitors. The current treatment provides moderate improvement. Compliance problems include diet and exercise.  There is no history of CAD/MI or CVA.  Hyperlipidemia This is a chronic problem. The current episode started more than 1 year ago. The problem is uncontrolled. Recent lipid tests were reviewed and are high. Exacerbating diseases include obesity. She has no history of diabetes or hypothyroidism. Pertinent negatives include no chest pain or shortness of breath. She is currently on no antihyperlipidemic treatment. The current treatment provides moderate improvement of lipids. Compliance problems include adherence to diet and adherence to exercise.  Risk factors for coronary artery disease include dyslipidemia and hypertension.      Review of Systems  Respiratory: Negative for shortness of breath.   Cardiovascular: Negative for chest pain and palpitations.  Musculoskeletal: Negative for neck pain.  Neurological: Negative for headaches.  All other systems reviewed and are negative.      Objective:   Physical Exam  Constitutional: She is oriented to person, place, and time. She appears well-developed and well-nourished.  HENT:  Nose: Nose normal.  Mouth/Throat: Oropharynx is clear and moist.  Eyes: EOM are normal.  Neck: Trachea normal, normal range of motion and full passive range of motion without pain. Neck  supple. No JVD present. Carotid bruit is not present. No thyromegaly present.  Cardiovascular: Normal rate, regular rhythm, normal heart sounds and intact distal pulses.  Exam reveals no gallop and no friction rub.   No murmur heard. Pulmonary/Chest: Effort normal and breath sounds normal.  Abdominal: Soft. Bowel sounds are normal. She exhibits no distension and no mass. There is no tenderness.  Musculoskeletal: Normal range of motion.  Lymphadenopathy:    She has no cervical adenopathy.  Neurological: She is alert and oriented to person, place, and time. She has normal reflexes.  Skin: Skin is warm and dry.  Psychiatric: She has a normal mood and affect. Her behavior is normal. Judgment and thought content normal.   BP 138/66 mmHg  Pulse 60  Temp(Src) 97 F (36.1 C) (Oral)  Ht $R'5\' 5"'gr$  (1.651 m)  Wt 184 lb (83.462 kg)  BMI 30.62 kg/m2        Assessment & Plan:   1. Essential hypertension, benign Do not add salt to diet - lisinopril-hydrochlorothiazide (PRINZIDE,ZESTORETIC) 20-12.5 MG per tablet; Take 1 tablet by mouth daily.  Dispense: 90 tablet; Refill: 1 - CMP14+EGFR  2. Mixed hyperlipidemia Low fat diet - pravastatin (PRAVACHOL) 80 MG tablet; TAKE 1 TABLET (80 MG TOTAL) BY MOUTH DAILY.  Dispense: 90 tablet; Refill: 1 - Lipid panel  3. BMI 30.0-30.9,adult Discussed diet and exercise for person with BMI >25 Will recheck weight in 3-6 months   pneumo 23 vaccine  Labs pending Health maintenance reviewed Diet and exercise encouraged Continue all meds Follow up  In 3 months   [place], FNP

## 2015-05-02 LAB — CMP14+EGFR
A/G RATIO: 1.7 (ref 1.1–2.5)
ALT: 11 IU/L (ref 0–32)
AST: 14 IU/L (ref 0–40)
Albumin: 4.2 g/dL (ref 3.6–4.8)
Alkaline Phosphatase: 105 IU/L (ref 39–117)
BUN / CREAT RATIO: 16 (ref 11–26)
BUN: 14 mg/dL (ref 8–27)
Bilirubin Total: 0.4 mg/dL (ref 0.0–1.2)
CO2: 24 mmol/L (ref 18–29)
CREATININE: 0.88 mg/dL (ref 0.57–1.00)
Calcium: 9.5 mg/dL (ref 8.7–10.3)
Chloride: 105 mmol/L (ref 97–108)
GFR calc Af Amer: 78 mL/min/{1.73_m2} (ref >59)
GFR calc non Af Amer: 68 mL/min/{1.73_m2} (ref >59)
GLOBULIN, TOTAL: 2.5 g/dL (ref 1.5–4.5)
Glucose: 99 mg/dL (ref 65–99)
Potassium: 4.1 mmol/L (ref 3.5–5.2)
Sodium: 143 mmol/L (ref 134–144)
Total Protein: 6.7 g/dL (ref 6.0–8.5)

## 2015-05-02 LAB — LIPID PANEL
CHOLESTEROL TOTAL: 145 mg/dL (ref 100–199)
Chol/HDL Ratio: 3.2 ratio units (ref 0.0–4.4)
HDL: 46 mg/dL (ref >39)
LDL CALC: 81 mg/dL (ref 0–99)
TRIGLYCERIDES: 91 mg/dL (ref 0–149)
VLDL CHOLESTEROL CAL: 18 mg/dL (ref 5–40)

## 2015-07-30 ENCOUNTER — Encounter: Payer: Self-pay | Admitting: Family Medicine

## 2015-08-03 ENCOUNTER — Ambulatory Visit (INDEPENDENT_AMBULATORY_CARE_PROVIDER_SITE_OTHER): Payer: BLUE CROSS/BLUE SHIELD | Admitting: *Deleted

## 2015-08-03 DIAGNOSIS — Z23 Encounter for immunization: Secondary | ICD-10-CM | POA: Diagnosis not present

## 2016-01-12 ENCOUNTER — Other Ambulatory Visit: Payer: Self-pay | Admitting: Nurse Practitioner

## 2016-01-12 NOTE — Telephone Encounter (Signed)
Only did one month supply- NTBS

## 2016-01-12 NOTE — Telephone Encounter (Signed)
Last seen 05/01/15  MMM  Requesting 90 day supply

## 2016-02-02 IMAGING — CR DG CHEST 2V
2 series · 2 of 2 positions shown · non-contrast
Comparison: None

CLINICAL DATA: Routine annual physical

EXAM:
CHEST  2 VIEW

[view not recorded (1 of 2)]
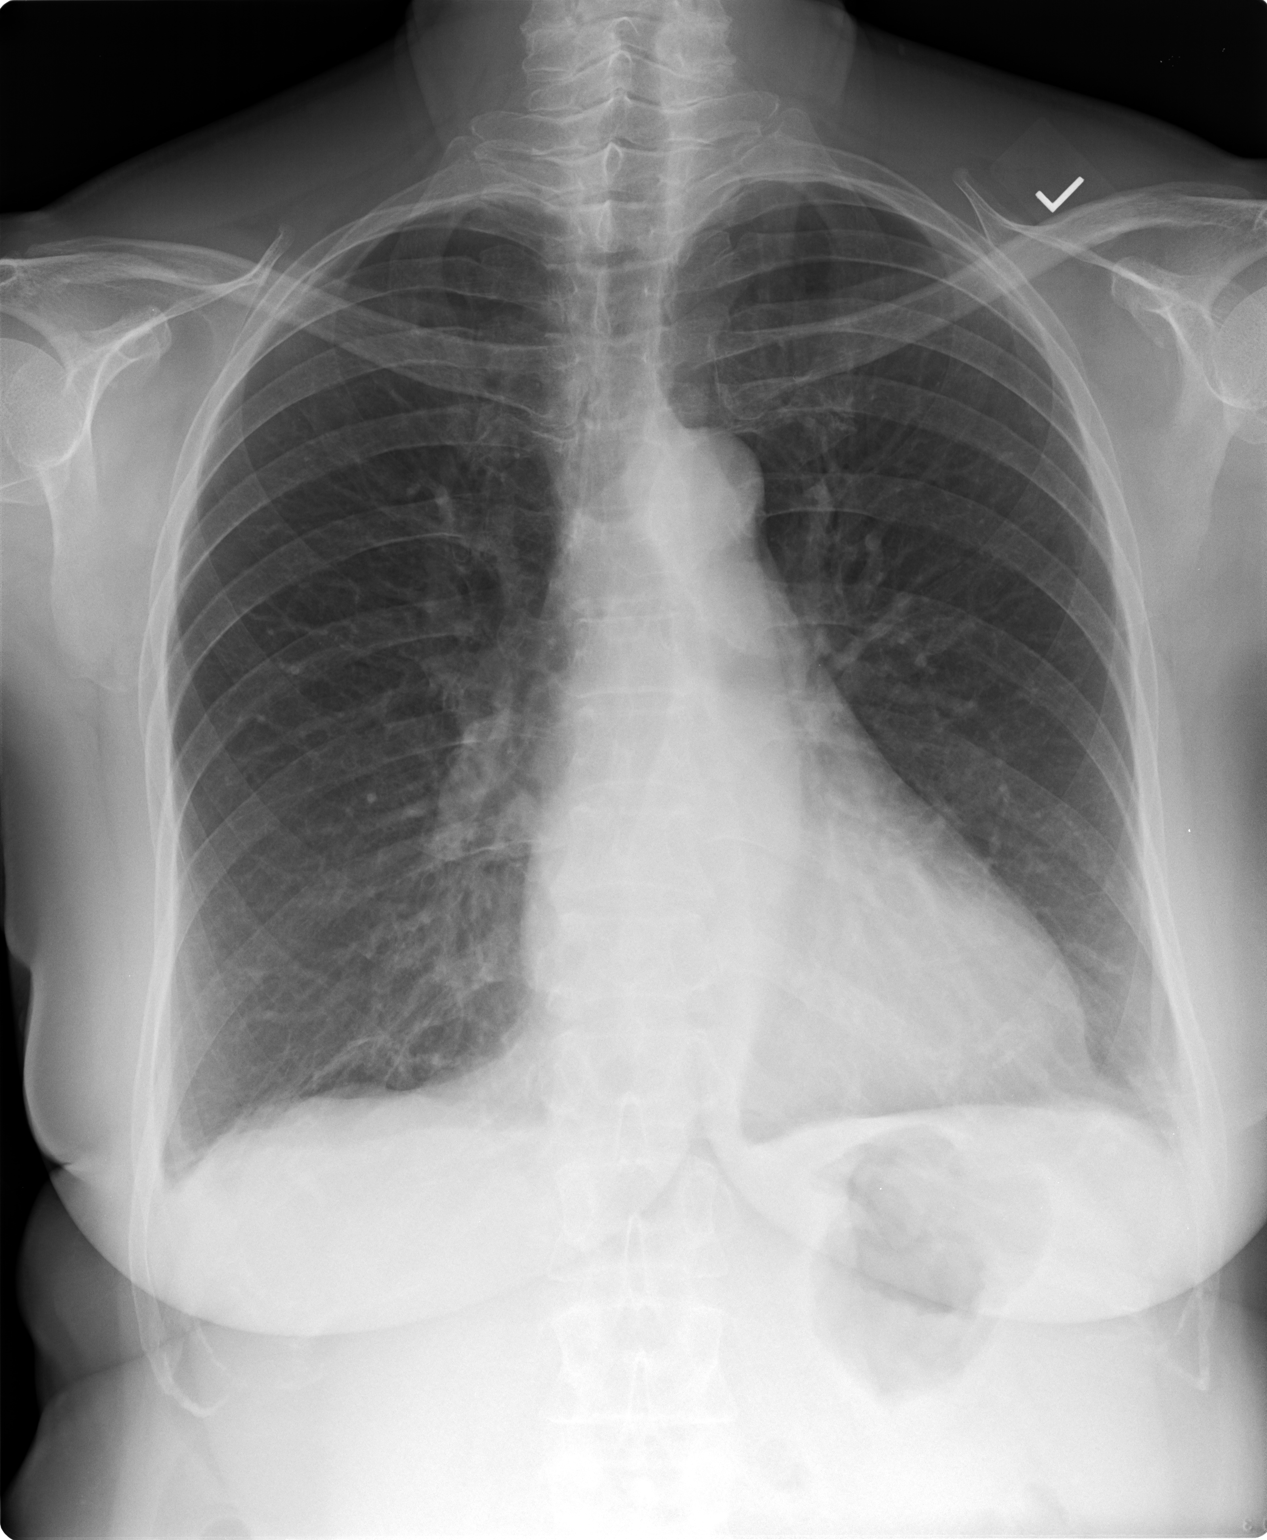

[view not recorded (2 of 2)]
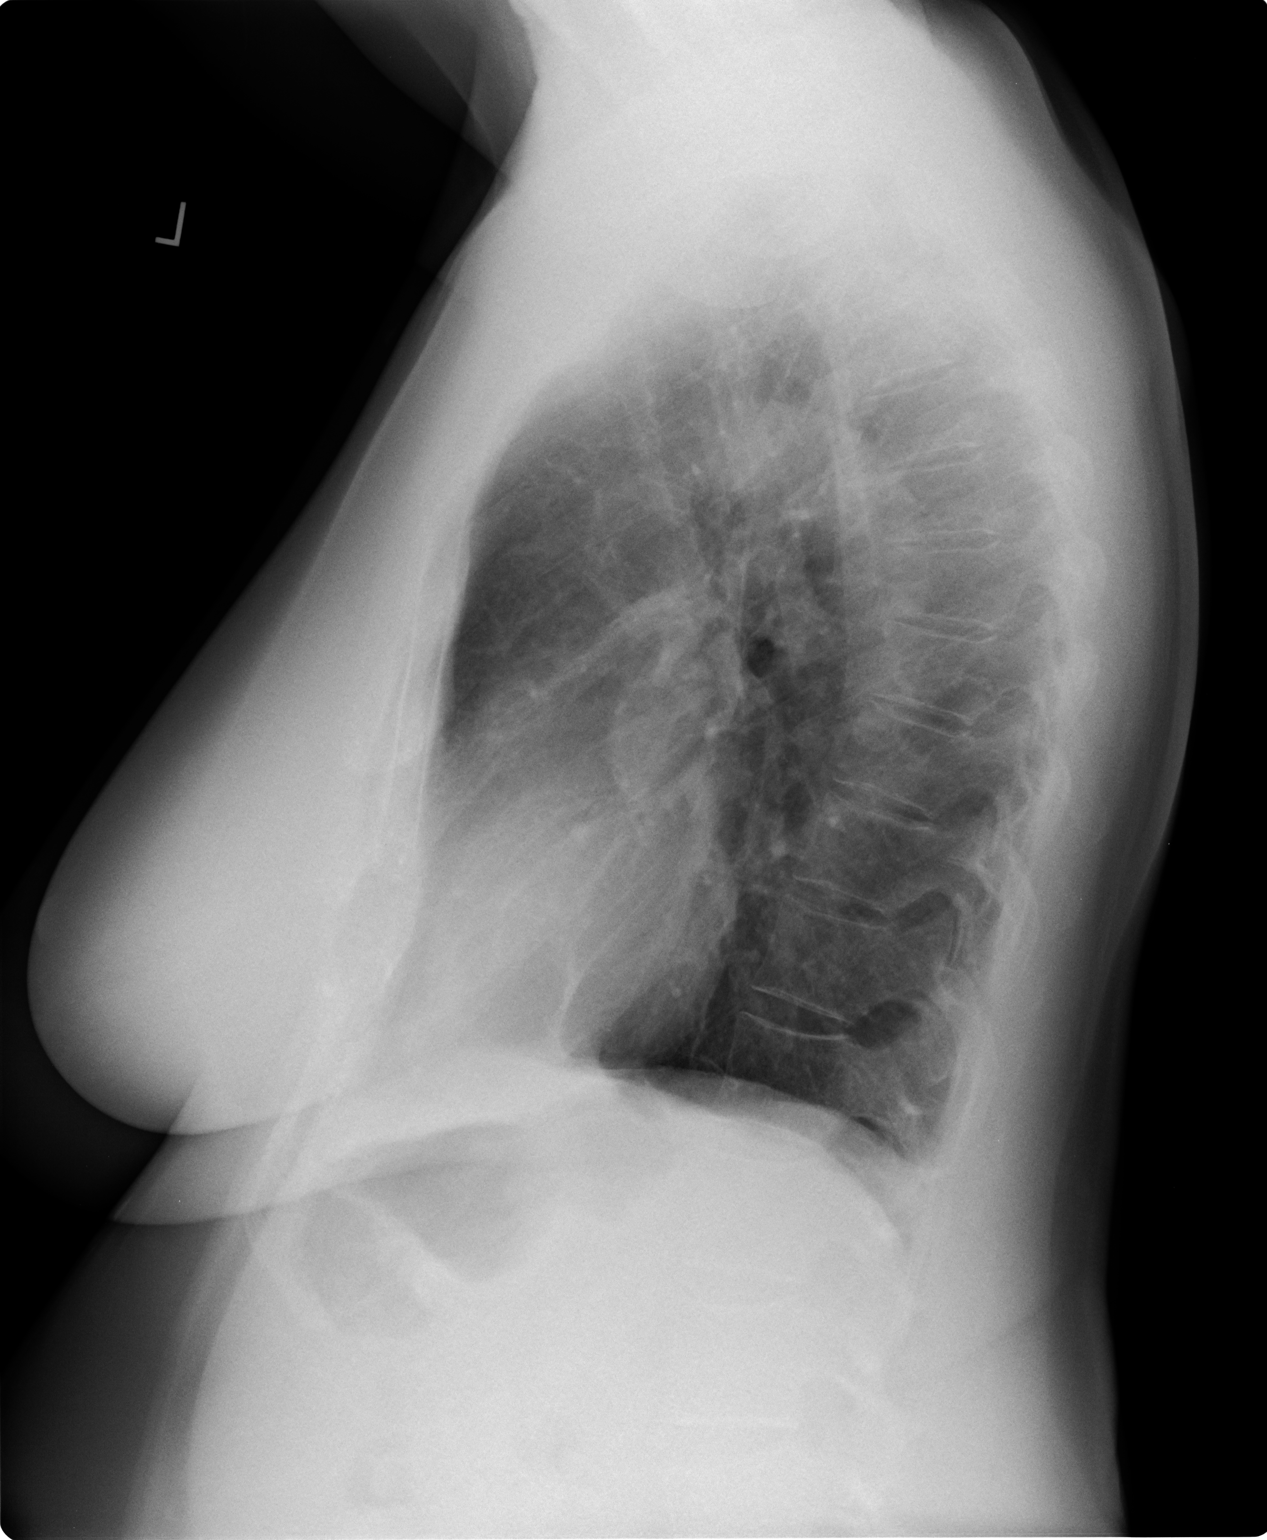

[2 of 2 positions shown; findings below may reference images not displayed]

FINDINGS: Cardiac shadow is within normal limits. The lungs are mildly
hyperinflated consistent with COPD. No focal infiltrate or sizable
effusion is noted. No acute bony abnormality is seen.
IMPRESSION: COPD without acute abnormality.

## 2016-02-15 ENCOUNTER — Ambulatory Visit: Payer: BLUE CROSS/BLUE SHIELD | Admitting: Nurse Practitioner

## 2016-02-18 ENCOUNTER — Ambulatory Visit (INDEPENDENT_AMBULATORY_CARE_PROVIDER_SITE_OTHER): Payer: BLUE CROSS/BLUE SHIELD | Admitting: Nurse Practitioner

## 2016-02-18 ENCOUNTER — Encounter: Payer: Self-pay | Admitting: Nurse Practitioner

## 2016-02-18 ENCOUNTER — Ambulatory Visit (INDEPENDENT_AMBULATORY_CARE_PROVIDER_SITE_OTHER): Payer: BLUE CROSS/BLUE SHIELD

## 2016-02-18 VITALS — BP 107/66 | HR 65 | Temp 97.1°F | Ht 65.0 in | Wt 175.0 lb

## 2016-02-18 DIAGNOSIS — I10 Essential (primary) hypertension: Secondary | ICD-10-CM

## 2016-02-18 DIAGNOSIS — Z0000 Encounter for general adult medical examination without abnormal findings: Secondary | ICD-10-CM

## 2016-02-18 DIAGNOSIS — E782 Mixed hyperlipidemia: Secondary | ICD-10-CM | POA: Diagnosis not present

## 2016-02-18 DIAGNOSIS — Z6829 Body mass index (BMI) 29.0-29.9, adult: Secondary | ICD-10-CM

## 2016-02-18 DIAGNOSIS — Z1212 Encounter for screening for malignant neoplasm of rectum: Secondary | ICD-10-CM

## 2016-02-18 DIAGNOSIS — Z1159 Encounter for screening for other viral diseases: Secondary | ICD-10-CM

## 2016-02-18 MED ORDER — LISINOPRIL-HYDROCHLOROTHIAZIDE 20-12.5 MG PO TABS: 1.0000 | ORAL_TABLET | Freq: Every day | ORAL | Status: DC

## 2016-02-18 NOTE — Progress Notes (Addendum)
Subjective:    Patient ID: Ariana Dillon, female    DOB: 03/03/1946, 70 y.o.   MRN: [id number]  Patient here today for complete physical exam and  follow up of chronic medical problems.  Outpatient Encounter Prescriptions as of 02/18/2016  Medication Sig  . lisinopril-hydrochlorothiazide (PRINZIDE,ZESTORETIC) 20-12.5 MG per tablet Take 1 tablet by mouth daily.              Hypertension This is a chronic problem. The current episode started more than 1 year ago. The problem is controlled. Pertinent negatives include no chest pain, headaches, neck pain, palpitations or shortness of breath. Risk factors for coronary artery disease include dyslipidemia, obesity and post-menopausal state. Past treatments include diuretics and ACE inhibitors. The current treatment provides moderate improvement. Compliance problems include diet and exercise.  There is no history of CAD/MI or CVA.  Hyperlipidemia This is a chronic problem. The current episode started more than 1 year ago. The problem is uncontrolled. Recent lipid tests were reviewed and are high. Exacerbating diseases include obesity. She has no history of diabetes or hypothyroidism. Pertinent negatives include no chest pain or shortness of breath. She is currently on no antihyperlipidemic treatment. The current treatment provides moderate improvement of lipids. Compliance problems include adherence to diet and adherence to exercise.  Risk factors for coronary artery disease include dyslipidemia and hypertension.      Review of Systems  Respiratory: Negative for shortness of breath.   Cardiovascular: Negative for chest pain and palpitations.  Musculoskeletal: Negative for neck pain.  Neurological: Negative for headaches.  All other systems reviewed and are negative.      Objective:   Physical Exam  Constitutional: She is oriented to person, place, and time. She appears well-developed and well-nourished.  HENT:  Nose: Nose normal.    Mouth/Throat: Oropharynx is clear and moist.  Eyes: EOM are normal.  Neck: Trachea normal, normal range of motion and full passive range of motion without pain. Neck supple. No JVD present. Carotid bruit is not present. No thyromegaly present.  Cardiovascular: Normal rate, regular rhythm, normal heart sounds and intact distal pulses.  Exam reveals no gallop and no friction rub.   No murmur heard. Pulmonary/Chest: Effort normal and breath sounds normal.  Abdominal: Soft. Bowel sounds are normal. She exhibits no distension and no mass. There is no tenderness.  Musculoskeletal: Normal range of motion.  Lymphadenopathy:    She has no cervical adenopathy.  Neurological: She is alert and oriented to person, place, and time. She has normal reflexes.  Skin: Skin is warm and dry.  Psychiatric: She has a normal mood and affect. Her behavior is normal. Judgment and thought content normal.   BP 107/66 mmHg  Pulse 65  Temp(Src) 97.1 F (36.2 C) (Oral)  Ht '5\' 5"'$  (1.651 m)  Wt 175 lb (79.379 kg)  BMI 29.12 kg/m2  Chest x ray- no cardio[pulmonary abnormality-Preliminary reading by Ronnald Collum, FNP  [facility]  EKG- Kerry Hough, FNP       Assessment & Plan:   1. Annual physical exam - Thyroid Panel With TSH - VITAMIN D 25 Hydroxy (Vit-D Deficiency, Fractures)  2. Essential hypertension, benign Do not add salt to diet - lisinopril-hydrochlorothiazide (PRINZIDE,ZESTORETIC) 20-12.5 MG tablet; Take 1 tablet by mouth daily.  Dispense: 90 tablet; Refill: 1 - CMP14+EGFR - DG Chest 2 View; Future - EKG 12-Lead  3. Mixed hyperlipidemia Low fta diet - Lipid panel  4. BMI 29.0-29.9,adult Discussed diet and exercise for person with BMI >25  Will recheck weight in 3-6 months  5. Need for hepatitis C screening test - Hepatitis C antibody  6. Screening for malignant neoplasm of the rectum - Fecal occult blood, imunochemical; Future    Labs pending Health maintenance reviewed Diet  and exercise encouraged Continue all meds Follow up  In 6 months   [place], FNP

## 2016-02-18 NOTE — Addendum Note (Signed)
Addended by: Bennie PieriniMARTIN, MARY-MARGARET on: 02/18/2016 09:07 AM   Modules accepted: Kipp BroodSmartSet

## 2016-02-18 NOTE — Patient Instructions (Signed)
Health Maintenance, Female Adopting a healthy lifestyle and getting preventive care can go a long way to promote health and wellness. Talk with your health care provider about what schedule of regular examinations is right for you. This is a good chance for you to check in with your provider about disease prevention and staying healthy. In between checkups, there are plenty of things you can do on your own. Experts have done a lot of research about which lifestyle changes and preventive measures are most likely to keep you healthy. Ask your health care provider for more information. WEIGHT AND DIET  Eat a healthy diet  Be sure to include plenty of vegetables, fruits, low-fat dairy products, and lean protein.  Do not eat a lot of foods high in solid fats, added sugars, or salt.  Get regular exercise. This is one of the most important things you can do for your health.  Most adults should exercise for at least 150 minutes each week. The exercise should increase your heart rate and make you sweat (moderate-intensity exercise).  Most adults should also do strengthening exercises at least twice a week. This is in addition to the moderate-intensity exercise.  Maintain a healthy weight  Body mass index (BMI) is a measurement that can be used to identify possible weight problems. It estimates body fat based on height and weight. Your health care provider can help determine your BMI and help you achieve or maintain a healthy weight.  For females 20 years of age and older:   A BMI below 18.5 is considered underweight.  A BMI of 18.5 to 24.9 is normal.  A BMI of 25 to 29.9 is considered overweight.  A BMI of 30 and above is considered obese.  Watch levels of cholesterol and blood lipids  You should start having your blood tested for lipids and cholesterol at 70 years of age, then have this test every 5 years.  You may need to have your cholesterol levels checked more often if:  Your lipid  or cholesterol levels are high.  You are older than 70 years of age.  You are at high risk for heart disease.  CANCER SCREENING   Lung Cancer  Lung cancer screening is recommended for adults 55-80 years old who are at high risk for lung cancer because of a history of smoking.  A yearly low-dose CT scan of the lungs is recommended for people who:  Currently smoke.  Have quit within the past 15 years.  Have at least a 30-pack-year history of smoking. A pack year is smoking an average of one pack of cigarettes a day for 1 year.  Yearly screening should continue until it has been 15 years since you quit.  Yearly screening should stop if you develop a health problem that would prevent you from having lung cancer treatment.  Breast Cancer  Practice breast self-awareness. This means understanding how your breasts normally appear and feel.  It also means doing regular breast self-exams. Let your health care provider know about any changes, no matter how small.  If you are in your 20s or 30s, you should have a clinical breast exam (CBE) by a health care provider every 1-3 years as part of a regular health exam.  If you are 40 or older, have a CBE every year. Also consider having a breast X-ray (mammogram) every year.  If you have a family history of breast cancer, talk to your health care provider about genetic screening.  If you   are at high risk for breast cancer, talk to your health care provider about having an MRI and a mammogram every year.  Breast cancer gene (BRCA) assessment is recommended for women who have family members with BRCA-related cancers. BRCA-related cancers include:  Breast.  Ovarian.  Tubal.  Peritoneal cancers.  Results of the assessment will determine the need for genetic counseling and BRCA1 and BRCA2 testing. Cervical Cancer Your health care provider may recommend that you be screened regularly for cancer of the pelvic organs (ovaries, uterus, and  vagina). This screening involves a pelvic examination, including checking for microscopic changes to the surface of your cervix (Pap test). You may be encouraged to have this screening done every 3 years, beginning at age 21.  For women ages 30-65, health care providers may recommend pelvic exams and Pap testing every 3 years, or they may recommend the Pap and pelvic exam, combined with testing for human papilloma virus (HPV), every 5 years. Some types of HPV increase your risk of cervical cancer. Testing for HPV may also be done on women of any age with unclear Pap test results.  Other health care providers may not recommend any screening for nonpregnant women who are considered low risk for pelvic cancer and who do not have symptoms. Ask your health care provider if a screening pelvic exam is right for you.  If you have had past treatment for cervical cancer or a condition that could lead to cancer, you need Pap tests and screening for cancer for at least 20 years after your treatment. If Pap tests have been discontinued, your risk factors (such as having a new sexual partner) need to be reassessed to determine if screening should resume. Some women have medical problems that increase the chance of getting cervical cancer. In these cases, your health care provider may recommend more frequent screening and Pap tests. Colorectal Cancer  This type of cancer can be detected and often prevented.  Routine colorectal cancer screening usually begins at 70 years of age and continues through 70 years of age.  Your health care provider may recommend screening at an earlier age if you have risk factors for colon cancer.  Your health care provider may also recommend using home test kits to check for hidden blood in the stool.  A small camera at the end of a tube can be used to examine your colon directly (sigmoidoscopy or colonoscopy). This is done to check for the earliest forms of colorectal  cancer.  Routine screening usually begins at age 50.  Direct examination of the colon should be repeated every 5-10 years through 70 years of age. However, you may need to be screened more often if early forms of precancerous polyps or small growths are found. Skin Cancer  Check your skin from head to toe regularly.  Tell your health care provider about any new moles or changes in moles, especially if there is a change in a mole's shape or color.  Also tell your health care provider if you have a mole that is larger than the size of a pencil eraser.  Always use sunscreen. Apply sunscreen liberally and repeatedly throughout the day.  Protect yourself by wearing long sleeves, pants, a wide-brimmed hat, and sunglasses whenever you are outside. HEART DISEASE, DIABETES, AND HIGH BLOOD PRESSURE   High blood pressure causes heart disease and increases the risk of stroke. High blood pressure is more likely to develop in:  People who have blood pressure in the high end   of the normal range (130-139/85-89 mm Hg).  People who are overweight or obese.  People who are African American.  If you are 38-23 years of age, have your blood pressure checked every 3-5 years. If you are 61 years of age or older, have your blood pressure checked every year. You should have your blood pressure measured twice--once when you are at a hospital or clinic, and once when you are not at a hospital or clinic. Record the average of the two measurements. To check your blood pressure when you are not at a hospital or clinic, you can use:  An automated blood pressure machine at a pharmacy.  A home blood pressure monitor.  If you are between 45 years and 39 years old, ask your health care provider if you should take aspirin to prevent strokes.  Have regular diabetes screenings. This involves taking a blood sample to check your fasting blood sugar level.  If you are at a normal weight and have a low risk for diabetes,  have this test once every three years after 70 years of age.  If you are overweight and have a high risk for diabetes, consider being tested at a younger age or more often. PREVENTING INFECTION  Hepatitis B  If you have a higher risk for hepatitis B, you should be screened for this virus. You are considered at high risk for hepatitis B if:  You were born in a country where hepatitis B is common. Ask your health care provider which countries are considered high risk.  Your parents were born in a high-risk country, and you have not been immunized against hepatitis B (hepatitis B vaccine).  You have HIV or AIDS.  You use needles to inject street drugs.  You live with someone who has hepatitis B.  You have had sex with someone who has hepatitis B.  You get hemodialysis treatment.  You take certain medicines for conditions, including cancer, organ transplantation, and autoimmune conditions. Hepatitis C  Blood testing is recommended for:  Everyone born from [birth year] through 1965.  Anyone with known risk factors for hepatitis C. Sexually transmitted infections (STIs)  You should be screened for sexually transmitted infections (STIs) including gonorrhea and chlamydia if:  You are sexually active and are younger than 70 years of age.  You are older than 70 years of age and your health care provider tells you that you are at risk for this type of infection.  Your sexual activity has changed since you were last screened and you are at an increased risk for chlamydia or gonorrhea. Ask your health care provider if you are at risk.  If you do not have HIV, but are at risk, it may be recommended that you take a prescription medicine daily to prevent HIV infection. This is called pre-exposure prophylaxis (PrEP). You are considered at risk if:  You are sexually active and do not regularly use condoms or know the HIV status of your partner(s).  You take drugs by injection.  You are sexually  active with a partner who has HIV. Talk with your health care provider about whether you are at high risk of being infected with HIV. If you choose to begin PrEP, you should first be tested for HIV. You should then be tested every 3 months for as long as you are taking PrEP.  PREGNANCY   If you are premenopausal and you may become pregnant, ask your health care provider about preconception counseling.  If you may  become pregnant, take 400 to 800 micrograms (mcg) of folic acid every day.  If you want to prevent pregnancy, talk to your health care provider about birth control (contraception). OSTEOPOROSIS AND MENOPAUSE   Osteoporosis is a disease in which the bones lose minerals and strength with aging. This can result in serious bone fractures. Your risk for osteoporosis can be identified using a bone density scan.  If you are 61 years of age or older, or if you are at risk for osteoporosis and fractures, ask your health care provider if you should be screened.  Ask your health care provider whether you should take a calcium or vitamin D supplement to lower your risk for osteoporosis.  Menopause may have certain physical symptoms and risks.  Hormone replacement therapy may reduce some of these symptoms and risks. Talk to your health care provider about whether hormone replacement therapy is right for you.  HOME CARE INSTRUCTIONS   Schedule regular health, dental, and eye exams.  Stay current with your immunizations.   Do not use any tobacco products including cigarettes, chewing tobacco, or electronic cigarettes.  If you are pregnant, do not drink alcohol.  If you are breastfeeding, limit how much and how often you drink alcohol.  Limit alcohol intake to no more than 1 drink per day for nonpregnant women. One drink equals 12 ounces of beer, 5 ounces of wine, or 1 ounces of hard liquor.  Do not use street drugs.  Do not share needles.  Ask your health care provider for help if  you need support or information about quitting drugs.  Tell your health care provider if you often feel depressed.  Tell your health care provider if you have ever been abused or do not feel safe at home.   This information is not intended to replace advice given to you by your health care provider. Make sure you discuss any questions you have with your health care provider.   Document Released: 04/25/2011 Document Revised: 10/31/2014 Document Reviewed: 09/11/2013 Elsevier Interactive Patient Education [employer].

## 2016-02-19 LAB — CMP14+EGFR
ALBUMIN: 4.3 g/dL (ref 3.6–4.8)
ALT: 13 IU/L (ref 0–32)
AST: 15 IU/L (ref 0–40)
Albumin/Globulin Ratio: 1.7 (ref 1.2–2.2)
Alkaline Phosphatase: 90 IU/L (ref 39–117)
BUN / CREAT RATIO: 29 — AB (ref 12–28)
BUN: 28 mg/dL — ABNORMAL HIGH (ref 8–27)
Bilirubin Total: 0.4 mg/dL (ref 0.0–1.2)
CALCIUM: 9.9 mg/dL (ref 8.7–10.3)
CO2: 23 mmol/L (ref 18–29)
CREATININE: 0.97 mg/dL (ref 0.57–1.00)
Chloride: 103 mmol/L (ref 96–106)
GFR, EST AFRICAN AMERICAN: 69 mL/min/{1.73_m2} (ref >59)
GFR, EST NON AFRICAN AMERICAN: 60 mL/min/{1.73_m2} (ref >59)
GLOBULIN, TOTAL: 2.6 g/dL (ref 1.5–4.5)
Glucose: 102 mg/dL — ABNORMAL HIGH (ref 65–99)
Potassium: 4.1 mmol/L (ref 3.5–5.2)
SODIUM: 142 mmol/L (ref 134–144)
TOTAL PROTEIN: 6.9 g/dL (ref 6.0–8.5)

## 2016-02-19 LAB — LIPID PANEL
CHOL/HDL RATIO: 4.2 ratio (ref 0.0–4.4)
Cholesterol, Total: 172 mg/dL (ref 100–199)
HDL: 41 mg/dL (ref >39)
LDL CALC: 111 mg/dL — AB (ref 0–99)
TRIGLYCERIDES: 100 mg/dL (ref 0–149)
VLDL Cholesterol Cal: 20 mg/dL (ref 5–40)

## 2016-02-19 LAB — THYROID PANEL WITH TSH
Free Thyroxine Index: 2.3 (ref 1.2–4.9)
T3 UPTAKE RATIO: 27 % (ref 24–39)
T4, Total: 8.4 ug/dL (ref 4.5–12.0)
TSH: 1.61 u[IU]/mL (ref 0.450–4.500)

## 2016-02-19 LAB — VITAMIN D 25 HYDROXY (VIT D DEFICIENCY, FRACTURES): VIT D 25 HYDROXY: 22.9 ng/mL — AB (ref 30.0–100.0)

## 2016-02-19 LAB — HEPATITIS C ANTIBODY: Hep C Virus Ab: <0.1 s/co ratio (ref 0.0–0.9)

## 2016-07-18 ENCOUNTER — Encounter: Payer: BLUE CROSS/BLUE SHIELD | Admitting: *Deleted

## 2016-07-25 ENCOUNTER — Ambulatory Visit (INDEPENDENT_AMBULATORY_CARE_PROVIDER_SITE_OTHER): Payer: BLUE CROSS/BLUE SHIELD

## 2016-07-25 DIAGNOSIS — Z23 Encounter for immunization: Secondary | ICD-10-CM | POA: Diagnosis not present

## 2016-08-14 ENCOUNTER — Other Ambulatory Visit: Payer: Self-pay | Admitting: Nurse Practitioner

## 2016-08-14 DIAGNOSIS — I10 Essential (primary) hypertension: Secondary | ICD-10-CM

## 2016-11-10 ENCOUNTER — Other Ambulatory Visit: Payer: Self-pay | Admitting: Nurse Practitioner

## 2016-11-10 DIAGNOSIS — I10 Essential (primary) hypertension: Secondary | ICD-10-CM

## 2016-11-11 NOTE — Telephone Encounter (Signed)
Last refill without being seen 

## 2016-11-11 NOTE — Telephone Encounter (Signed)
LMOVM that pt will NTBS before next refill

## 2016-11-24 ENCOUNTER — Ambulatory Visit (INDEPENDENT_AMBULATORY_CARE_PROVIDER_SITE_OTHER): Payer: BLUE CROSS/BLUE SHIELD | Admitting: Nurse Practitioner

## 2016-11-24 ENCOUNTER — Encounter: Payer: Self-pay | Admitting: Nurse Practitioner

## 2016-11-24 VITALS — BP 119/70 | HR 67 | Temp 97.2°F | Ht 65.0 in | Wt 174.0 lb

## 2016-11-24 DIAGNOSIS — E782 Mixed hyperlipidemia: Secondary | ICD-10-CM

## 2016-11-24 DIAGNOSIS — I10 Essential (primary) hypertension: Secondary | ICD-10-CM

## 2016-11-24 DIAGNOSIS — Z6829 Body mass index (BMI) 29.0-29.9, adult: Secondary | ICD-10-CM

## 2016-11-24 DIAGNOSIS — Z0000 Encounter for general adult medical examination without abnormal findings: Secondary | ICD-10-CM | POA: Diagnosis not present

## 2016-11-24 MED ORDER — LISINOPRIL-HYDROCHLOROTHIAZIDE 20-12.5 MG PO TABS: 1.0000 | ORAL_TABLET | Freq: Every day | ORAL | 5 refills | Status: DC

## 2016-11-24 NOTE — Patient Instructions (Signed)
Advance Directive Advance directives are the legal documents that allow you to make choices about your health care and medical treatment if you cannot speak for yourself. Advance directives are a way for you to communicate your wishes to family, friends, and health care providers. The specified people can then convey your decisions about end-of-life care to avoid confusion if you should become unable to communicate. Ideally, the process of discussing and writing advance directives should happen over time rather than making decisions all at once. Advance directives can be modified as your situation changes, and you can change your mind at any time, even after you have signed the advance directives. Each state has its own laws regarding advance directives. You may want to check with your health care provider, attorney, or state representative about the law in your state. Below are some examples of advance directives. HEALTH CARE PROXY AND DURABLE POWER OF ATTORNEY FOR HEALTH CARE A health care proxy is a person (agent) appointed to make medical decisions for you if you cannot. Generally, people choose someone they know well and trust to represent their preferences when they can no longer do so. You should be sure to ask this person for agreement to act as your agent. An agent may have to exercise judgment in the event of a medical decision for which your wishes are not known. A durable power of attorney for health care is a legal document that names your health care proxy. Depending on the laws in your state, after the document is written, it may also need to be:  Signed.  Notarized.  Dated.  Copied.  Witnessed.  Incorporated into your medical record. You may also want to appoint someone to manage your financial affairs if you cannot. This is called a durable power of attorney for finances. It is a separate legal document from the durable power of attorney for health care. You may choose the same  person or someone different from your health care proxy to act as your agent in financial matters. LIVING WILL A living will is a set of instructions documenting your wishes about medical care when you cannot care for yourself. It is used if you become:  Terminally ill.  Incapacitated.  Unable to communicate.  Unable to make decisions. Items to consider in your living will include:  The use or non-use of life-sustaining equipment, such as dialysis machines and breathing machines (ventilators).  A do not resuscitate (DNR) order, which is the instruction not to use cardiopulmonary resuscitation (CPR) if breathing or heartbeat stops.  Tube feeding.  Withholding of food and fluids.  Comfort (palliative) care when the goal becomes comfort rather than a cure.  Organ and tissue donation. A living will does not give instructions about distribution of your money and property if you should pass away. It is advisable to seek the expert advice of a lawyer in drawing up a will regarding your possessions. Decisions about taxes, beneficiaries, and asset distribution will be legally binding. This process can relieve your family and friends of any burdens surrounding disputes or questions that may come up about the allocation of your assets. DO NOT RESUSCITATE (DNR) A do not resuscitate (DNR) order is a request to not have CPR in the event that your heart stops beating or you stop breathing. Unless given other instructions, a health care provider will try to help any patient whose heart has stopped or who has stopped breathing.  This information is not intended to replace advice given to   you by your health care provider. Make sure you discuss any questions you have with your health care provider. Document Released: 01/17/2008 Document Revised: 02/01/2016 Document Reviewed: 02/27/2013 Elsevier Interactive Patient Education  2017 Elsevier Inc.  

## 2016-11-24 NOTE — Progress Notes (Signed)
Subjective:    Patient ID: Ariana Dillon, female    DOB: [birth year], 71 y.o.   MRN: [id number]  Patient in today for annual physical exam  ( NO PAP ) and follow up of chronic medical problems.- she is doing well without complaints.  Outpatient Encounter Prescriptions as of 11/24/2016  Medication Sig  . lisinopril-hydrochlorothiazide (PRINZIDE,ZESTORETIC) 20-12.5 MG tablet TAKE 1 TABLET BY MOUTH DAILY.   No facility-administered encounter medications on file as of 11/24/2016.    Hypertension  This is a chronic problem. The current episode started more than 1 year ago. The problem is controlled. Pertinent negatives include no chest pain, headaches, neck pain, palpitations or shortness of breath. Risk factors for coronary artery disease include dyslipidemia, obesity and post-menopausal state. Past treatments include diuretics and ACE inhibitors. The current treatment provides moderate improvement. Compliance problems include diet and exercise.  There is no history of CAD/MI or CVA.  Hyperlipidemia  This is a chronic problem. The current episode started more than 1 year ago. The problem is uncontrolled. Recent lipid tests were reviewed and are high. Exacerbating diseases include obesity. She has no history of diabetes or hypothyroidism. Pertinent negatives include no chest pain or shortness of breath. She is currently on no antihyperlipidemic treatment. The current treatment provides moderate improvement of lipids. Compliance problems include adherence to diet and adherence to exercise.  Risk factors for coronary artery disease include dyslipidemia and hypertension.      Review of Systems  Respiratory: Negative for shortness of breath.   Cardiovascular: Negative for chest pain and palpitations.  Musculoskeletal: Negative for neck pain.  Neurological: Negative for headaches.  All other systems reviewed and are negative.      Objective:   Physical Exam  Constitutional: She is oriented to person,  place, and time. She appears well-developed and well-nourished.  HENT:  Nose: Nose normal.  Mouth/Throat: Oropharynx is clear and moist.  Eyes: EOM are normal.  Neck: Trachea normal, normal range of motion and full passive range of motion without pain. Neck supple. No JVD present. Carotid bruit is not present. No thyromegaly present.  Cardiovascular: Normal rate, regular rhythm, normal heart sounds and intact distal pulses.  Exam reveals no gallop and no friction rub.   No murmur heard. Pulmonary/Chest: Effort normal and breath sounds normal.  Abdominal: Soft. Bowel sounds are normal. She exhibits no distension and no mass. There is no tenderness.  Musculoskeletal: Normal range of motion.  Lymphadenopathy:    She has no cervical adenopathy.  Neurological: She is alert and oriented to person, place, and time. She has normal reflexes.  Skin: Skin is warm and dry.  Psychiatric: She has a normal mood and affect. Her behavior is normal. Judgment and thought content normal.   BP 119/70   Pulse 67   Temp 97.2 F (36.2 C) (Oral)   Ht '5\' 5"'$  (1.651 m)   Wt 174 lb (78.9 kg)   BMI 28.96 kg/m         Assessment & Plan:   1. Annual physical exam - CBC with Differential/Platelet - Thyroid Panel With TSH  2. Essential hypertension, benign Low sodium diet - lisinopril-hydrochlorothiazide (PRINZIDE,ZESTORETIC) 20-12.5 MG tablet; Take 1 tablet by mouth daily.  Dispense: 30 tablet; Refill: 5 - CMP14+EGFR  3. Mixed hyperlipidemia Low fat diet - Lipid panel  4. BMI 29.0-29.9,adult Discussed diet and exercise for person with BMI >25 Will recheck weight in 3-6 months    Labs pending Health maintenance reviewed Diet and exercise encouraged  Continue all meds Follow up  In 6 months   [place], FNP

## 2016-11-25 LAB — LIPID PANEL
CHOLESTEROL TOTAL: 160 mg/dL (ref 100–199)
Chol/HDL Ratio: 3.5 ratio units (ref 0.0–4.4)
HDL: 46 mg/dL (ref >39)
LDL CALC: 99 mg/dL (ref 0–99)
TRIGLYCERIDES: 77 mg/dL (ref 0–149)
VLDL CHOLESTEROL CAL: 15 mg/dL (ref 5–40)

## 2016-11-25 LAB — CMP14+EGFR
ALK PHOS: 92 IU/L (ref 39–117)
ALT: 10 IU/L (ref 0–32)
AST: 14 IU/L (ref 0–40)
Albumin/Globulin Ratio: 1.7 (ref 1.2–2.2)
Albumin: 4.3 g/dL (ref 3.5–4.8)
BILIRUBIN TOTAL: 0.3 mg/dL (ref 0.0–1.2)
BUN/Creatinine Ratio: 20 (ref 12–28)
BUN: 19 mg/dL (ref 8–27)
CHLORIDE: 102 mmol/L (ref 96–106)
CO2: 27 mmol/L (ref 18–29)
CREATININE: 0.95 mg/dL (ref 0.57–1.00)
Calcium: 9.6 mg/dL (ref 8.7–10.3)
GFR calc Af Amer: 70 mL/min/{1.73_m2} (ref >59)
GFR calc non Af Amer: 61 mL/min/{1.73_m2} (ref >59)
Globulin, Total: 2.5 g/dL (ref 1.5–4.5)
Glucose: 102 mg/dL — ABNORMAL HIGH (ref 65–99)
Potassium: 4.3 mmol/L (ref 3.5–5.2)
Sodium: 141 mmol/L (ref 134–144)
Total Protein: 6.8 g/dL (ref 6.0–8.5)

## 2016-11-25 LAB — CBC WITH DIFFERENTIAL/PLATELET
BASOS ABS: 0 10*3/uL (ref 0.0–0.2)
Basos: 1 %
EOS (ABSOLUTE): 0.1 10*3/uL (ref 0.0–0.4)
Eos: 1 %
Hematocrit: 38.4 % (ref 34.0–46.6)
Hemoglobin: 12.7 g/dL (ref 11.1–15.9)
Immature Grans (Abs): 0 10*3/uL (ref 0.0–0.1)
Immature Granulocytes: 0 %
LYMPHS ABS: 1.7 10*3/uL (ref 0.7–3.1)
Lymphs: 26 %
MCH: 29.1 pg (ref 26.6–33.0)
MCHC: 33.1 g/dL (ref 31.5–35.7)
MCV: 88 fL (ref 79–97)
MONOCYTES: 8 %
MONOS ABS: 0.5 10*3/uL (ref 0.1–0.9)
Neutrophils Absolute: 4.2 10*3/uL (ref 1.4–7.0)
Neutrophils: 64 %
Platelets: 180 10*3/uL (ref 150–379)
RBC: 4.37 x10E6/uL (ref 3.77–5.28)
RDW: 12.9 % (ref 12.3–15.4)
WBC: 6.6 10*3/uL (ref 3.4–10.8)

## 2016-11-25 LAB — THYROID PANEL WITH TSH
FREE THYROXINE INDEX: 1.8 (ref 1.2–4.9)
T3 Uptake Ratio: 27 % (ref 24–39)
T4 TOTAL: 6.8 ug/dL (ref 4.5–12.0)
TSH: 1.77 u[IU]/mL (ref 0.450–4.500)

## 2017-04-12 ENCOUNTER — Other Ambulatory Visit: Payer: Self-pay

## 2017-04-12 DIAGNOSIS — I10 Essential (primary) hypertension: Secondary | ICD-10-CM

## 2017-04-12 MED ORDER — LISINOPRIL-HYDROCHLOROTHIAZIDE 20-12.5 MG PO TABS: 1.0000 | ORAL_TABLET | Freq: Every day | ORAL | 1 refills | Status: DC

## 2017-05-26 IMAGING — CR DG CHEST 2V
2 series · 2 of 2 positions shown · non-contrast
Comparison: 10/27/2014

CLINICAL DATA: Routine physical exam

EXAM:
CHEST  2 VIEW

[view not recorded (1 of 2)]
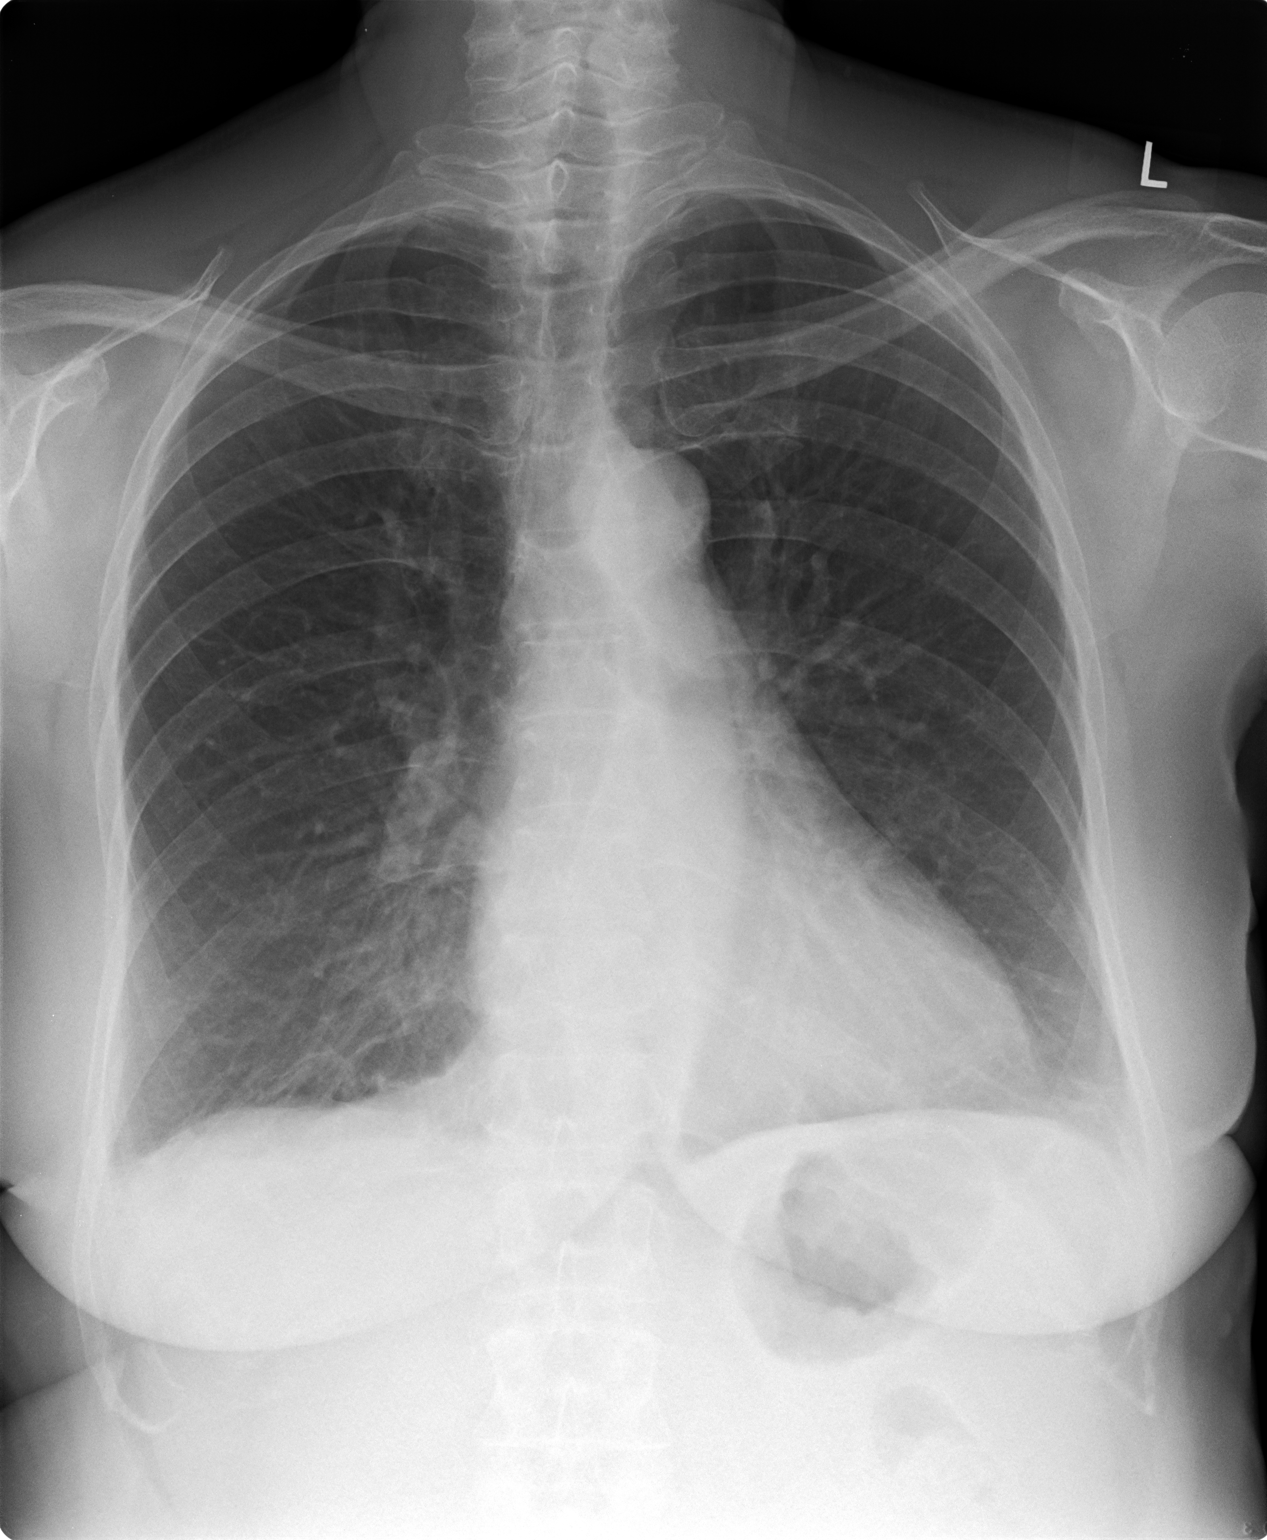

[view not recorded (2 of 2)]
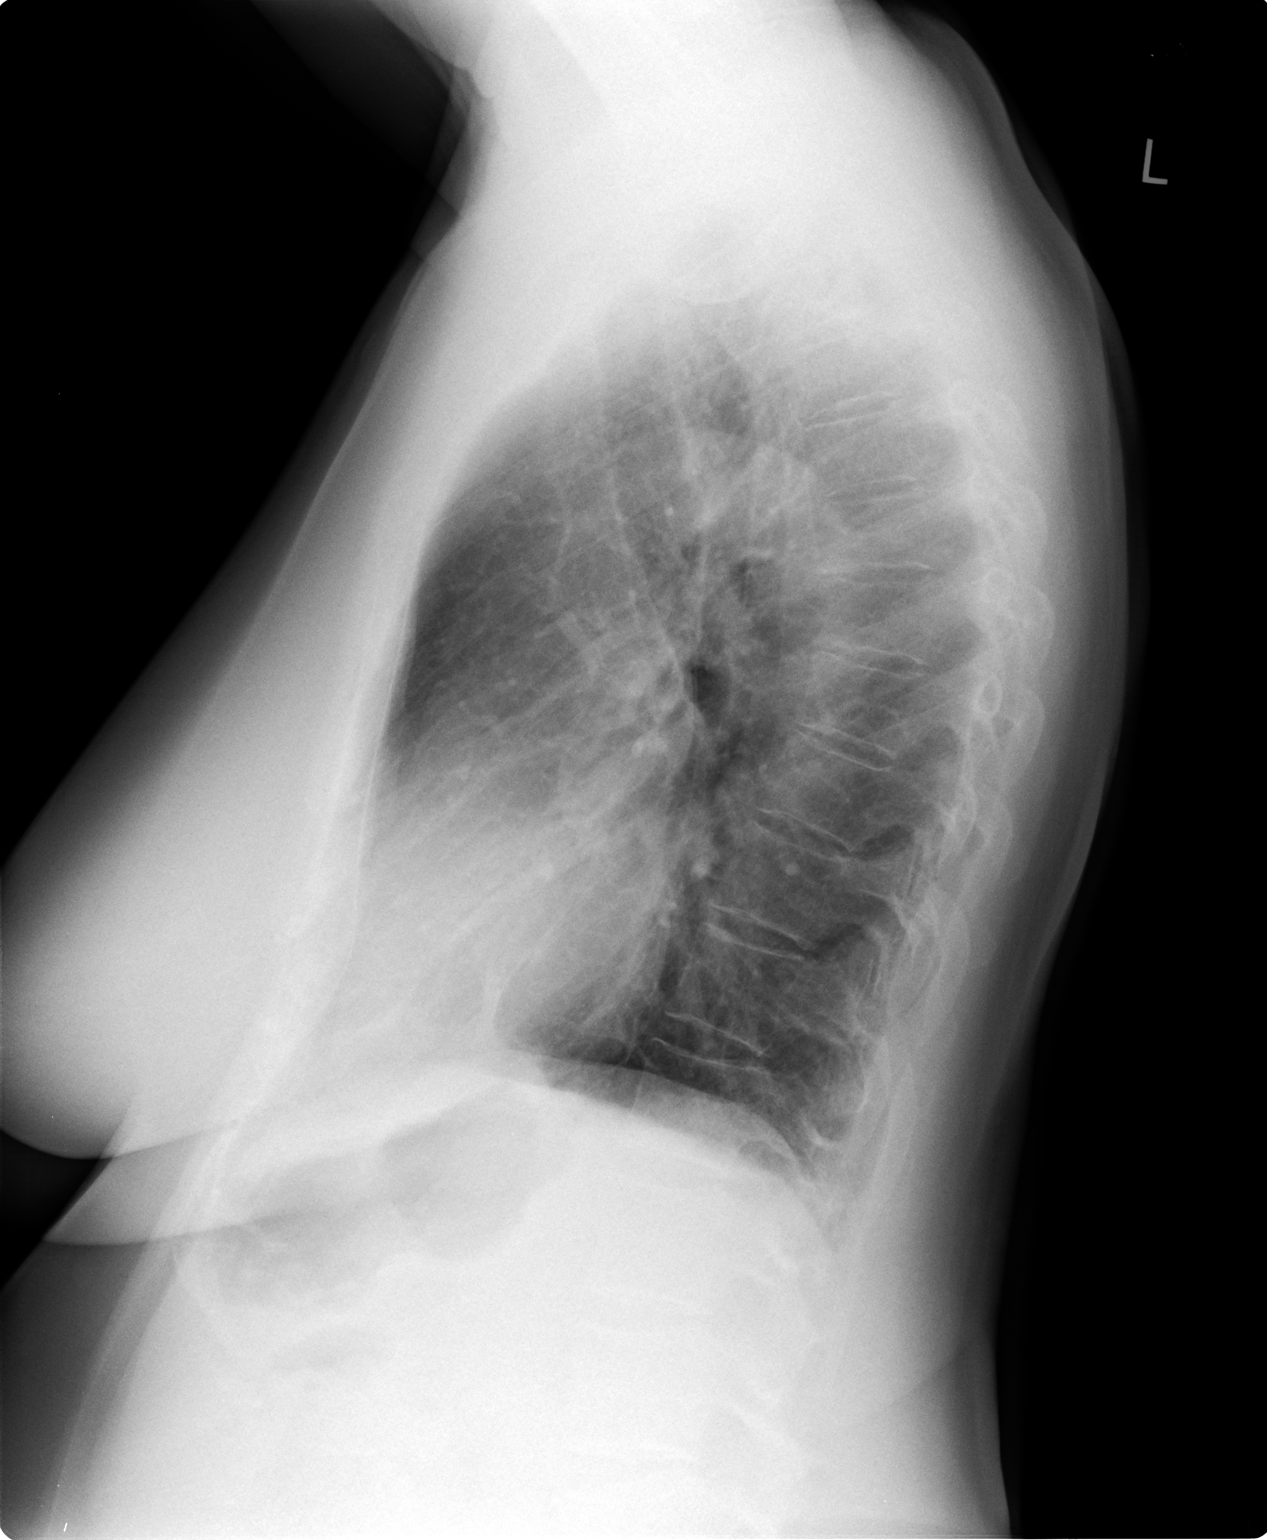

[2 of 2 positions shown; findings below may reference images not displayed]

FINDINGS: The heart is mildly enlarged. Normal vascularity. Query changes at
the lung bases. No pneumothorax or pleural effusion. Hyperaeration.
IMPRESSION: No active cardiopulmonary disease.

## 2017-10-31 ENCOUNTER — Other Ambulatory Visit: Payer: Self-pay | Admitting: *Deleted

## 2017-10-31 DIAGNOSIS — I10 Essential (primary) hypertension: Secondary | ICD-10-CM

## 2017-11-01 ENCOUNTER — Other Ambulatory Visit: Payer: Self-pay | Admitting: Nurse Practitioner

## 2017-11-01 DIAGNOSIS — I10 Essential (primary) hypertension: Secondary | ICD-10-CM

## 2017-11-01 NOTE — Telephone Encounter (Signed)
Last seen 11/24/16

## 2017-11-02 NOTE — Telephone Encounter (Signed)
Last refill without being seen 

## 2017-11-03 ENCOUNTER — Other Ambulatory Visit: Payer: Self-pay | Admitting: Nurse Practitioner

## 2017-11-03 DIAGNOSIS — I10 Essential (primary) hypertension: Secondary | ICD-10-CM

## 2017-11-30 ENCOUNTER — Encounter: Payer: Self-pay | Admitting: Nurse Practitioner

## 2017-11-30 ENCOUNTER — Ambulatory Visit (INDEPENDENT_AMBULATORY_CARE_PROVIDER_SITE_OTHER): Payer: BLUE CROSS/BLUE SHIELD

## 2017-11-30 ENCOUNTER — Ambulatory Visit (INDEPENDENT_AMBULATORY_CARE_PROVIDER_SITE_OTHER): Payer: BLUE CROSS/BLUE SHIELD | Admitting: Nurse Practitioner

## 2017-11-30 VITALS — BP 108/66 | HR 71 | Temp 97.5°F | Ht 65.0 in | Wt 183.0 lb

## 2017-11-30 DIAGNOSIS — Z0000 Encounter for general adult medical examination without abnormal findings: Secondary | ICD-10-CM

## 2017-11-30 DIAGNOSIS — E782 Mixed hyperlipidemia: Secondary | ICD-10-CM

## 2017-11-30 DIAGNOSIS — I10 Essential (primary) hypertension: Secondary | ICD-10-CM

## 2017-11-30 DIAGNOSIS — Z6829 Body mass index (BMI) 29.0-29.9, adult: Secondary | ICD-10-CM

## 2017-11-30 MED ORDER — LISINOPRIL-HYDROCHLOROTHIAZIDE 20-12.5 MG PO TABS: 1.0000 | ORAL_TABLET | Freq: Every day | ORAL | 5 refills | Status: DC

## 2017-11-30 NOTE — Progress Notes (Signed)
   Subjective:    Patient ID: Ariana Dillon, female    DOB: 08-Jun-1946, 72 y.o.   MRN: [id number]  HPI  Patient comes in today for annual physical exam.   Patient Active Problem List   Diagnosis Date Noted  . BMI 29.0-29.9,adult No recent weight changes 05/01/2015  . MIXED HYPERLIPIDEMIA Tries to watch diet 01/14/2009  . ESSENTIAL HYPERTENSION, BENIGN No c/o chest pain, sob or headache. Does not check blood pressure at home. BP Readings from Last 3 Encounters:  11/30/17 108/66  11/24/16 119/70  02/18/16 107/66    01/14/2009  . SYSTOLIC MURMUR No problems 01/14/2009   Outpatient Encounter Medications as of 11/30/2017  Medication Sig  . lisinopril-hydrochlorothiazide (PRINZIDE,ZESTORETIC) 20-12.5 MG tablet TAKE 1 TABLET BY MOUTH EVERY DAY     Review of Systems  Constitutional: Negative for activity change and appetite change.  HENT: Negative.   Eyes: Negative for pain.  Respiratory: Negative for shortness of breath.   Cardiovascular: Negative for chest pain, palpitations and leg swelling.  Gastrointestinal: Negative for abdominal pain.  Endocrine: Negative for polydipsia.  Genitourinary: Negative.   Skin: Negative for rash.  Neurological: Negative for dizziness, weakness and headaches.  Hematological: Does not bruise/bleed easily.  Psychiatric/Behavioral: Negative.   All other systems reviewed and are negative.      Objective:   Physical Exam  Constitutional: She is oriented to person, place, and time. She appears well-developed and well-nourished.  HENT:  Nose: Nose normal.  Mouth/Throat: Oropharynx is clear and moist.  Eyes: EOM are normal.  Neck: Trachea normal, normal range of motion and full passive range of motion without pain. Neck supple. No JVD present. Carotid bruit is not present. No thyromegaly present.  Cardiovascular: Normal rate, regular rhythm, normal heart sounds and intact distal pulses. Exam reveals no gallop and no friction rub.  No murmur  heard. Pulmonary/Chest: Effort normal and breath sounds normal.  Abdominal: Soft. Bowel sounds are normal. She exhibits no distension and no mass. There is no tenderness.  Musculoskeletal: Normal range of motion. She exhibits edema (1+ edema bil lower ext).  Lymphadenopathy:    She has no cervical adenopathy.  Neurological: She is alert and oriented to person, place, and time. She has normal reflexes.  Skin: Skin is warm and dry.  Psychiatric: She has a normal mood and affect. Her behavior is normal. Judgment and thought content normal.    BP 108/66   Pulse 71   Temp (!) 97.5 F (36.4 C) (Oral)   Ht '5\' 5"'$  (1.651 m)   Wt 183 lb (83 kg)   BMI 30.45 kg/m   EKG- NSR-Mary-Margaret Hassell Done, FNP  Chest xray normal-Preliminary reading by Ronnald Collum, FNP  [facility]      Assessment & Plan:  1. Annual physical exam  - CBC with Differential/Platelet - Thyroid Panel With TSH  2. Essential hypertension, benign Low sodium diet - CMP14+EGFR - DG Chest 2 View; Future - EKG 12-Lead - lisinopril-hydrochlorothiazide (PRINZIDE,ZESTORETIC) 20-12.5 MG tablet; Take 1 tablet by mouth daily.  Dispense: 30 tablet; Refill: 5  3. Mixed hyperlipidemia Low fat diet - Lipid panel  4. BMI 29.0-29.9,adult Discussed diet and exercise for person with BMI >25 Will recheck weight in 3-6 months    Labs pending Health maintenance reviewed Diet and exercise encouraged Continue all meds Follow up  In 6 months   [place], FNP

## 2017-11-30 NOTE — Patient Instructions (Signed)

## 2017-12-01 LAB — CMP14+EGFR
ALBUMIN: 4.3 g/dL (ref 3.5–4.8)
ALK PHOS: 96 IU/L (ref 39–117)
ALT: 15 IU/L (ref 0–32)
AST: 18 IU/L (ref 0–40)
Albumin/Globulin Ratio: 1.5 (ref 1.2–2.2)
BILIRUBIN TOTAL: 0.4 mg/dL (ref 0.0–1.2)
BUN/Creatinine Ratio: 23 (ref 12–28)
BUN: 23 mg/dL (ref 8–27)
CHLORIDE: 104 mmol/L (ref 96–106)
CO2: 23 mmol/L (ref 20–29)
CREATININE: 1 mg/dL (ref 0.57–1.00)
Calcium: 10.1 mg/dL (ref 8.7–10.3)
GFR calc non Af Amer: 57 mL/min/{1.73_m2} — ABNORMAL LOW (ref >59)
GFR, EST AFRICAN AMERICAN: 65 mL/min/{1.73_m2} (ref >59)
GLUCOSE: 98 mg/dL (ref 65–99)
Globulin, Total: 2.8 g/dL (ref 1.5–4.5)
Potassium: 4.4 mmol/L (ref 3.5–5.2)
Sodium: 141 mmol/L (ref 134–144)
TOTAL PROTEIN: 7.1 g/dL (ref 6.0–8.5)

## 2017-12-01 LAB — LIPID PANEL
CHOLESTEROL TOTAL: 176 mg/dL (ref 100–199)
Chol/HDL Ratio: 3.7 ratio (ref 0.0–4.4)
HDL: 48 mg/dL (ref >39)
LDL CALC: 110 mg/dL — AB (ref 0–99)
Triglycerides: 92 mg/dL (ref 0–149)
VLDL CHOLESTEROL CAL: 18 mg/dL (ref 5–40)

## 2017-12-01 LAB — CBC WITH DIFFERENTIAL/PLATELET
Basophils Absolute: 0 10*3/uL (ref 0.0–0.2)
Basos: 1 %
EOS (ABSOLUTE): 0.1 10*3/uL (ref 0.0–0.4)
Eos: 1 %
HEMOGLOBIN: 12.9 g/dL (ref 11.1–15.9)
Hematocrit: 38 % (ref 34.0–46.6)
IMMATURE GRANS (ABS): 0 10*3/uL (ref 0.0–0.1)
Immature Granulocytes: 0 %
LYMPHS: 30 %
Lymphocytes Absolute: 2.3 10*3/uL (ref 0.7–3.1)
MCH: 29.4 pg (ref 26.6–33.0)
MCHC: 33.9 g/dL (ref 31.5–35.7)
MCV: 87 fL (ref 79–97)
MONOCYTES: 8 %
Monocytes Absolute: 0.7 10*3/uL (ref 0.1–0.9)
NEUTROS ABS: 4.7 10*3/uL (ref 1.4–7.0)
NEUTROS PCT: 60 %
PLATELETS: 215 10*3/uL (ref 150–379)
RBC: 4.39 x10E6/uL (ref 3.77–5.28)
RDW: 13.5 % (ref 12.3–15.4)
WBC: 7.8 10*3/uL (ref 3.4–10.8)

## 2017-12-01 LAB — THYROID PANEL WITH TSH
FREE THYROXINE INDEX: 1.9 (ref 1.2–4.9)
T3 Uptake Ratio: 25 % (ref 24–39)
T4, Total: 7.5 ug/dL (ref 4.5–12.0)
TSH: 2.25 u[IU]/mL (ref 0.450–4.500)

## 2017-12-04 ENCOUNTER — Encounter: Payer: BLUE CROSS/BLUE SHIELD | Admitting: Nurse Practitioner

## 2017-12-07 ENCOUNTER — Other Ambulatory Visit: Payer: Self-pay | Admitting: Nurse Practitioner

## 2017-12-07 DIAGNOSIS — I10 Essential (primary) hypertension: Secondary | ICD-10-CM

## 2018-07-23 LAB — HM MAMMOGRAPHY

## 2018-08-10 ENCOUNTER — Ambulatory Visit (INDEPENDENT_AMBULATORY_CARE_PROVIDER_SITE_OTHER): Payer: Managed Care, Other (non HMO)

## 2018-08-10 DIAGNOSIS — Z23 Encounter for immunization: Secondary | ICD-10-CM | POA: Diagnosis not present

## 2018-11-14 ENCOUNTER — Other Ambulatory Visit: Payer: Self-pay | Admitting: Nurse Practitioner

## 2018-11-14 DIAGNOSIS — I10 Essential (primary) hypertension: Secondary | ICD-10-CM

## 2018-11-14 NOTE — Telephone Encounter (Signed)
Last seen 11/30/17 MMM  Needs to be seen

## 2018-11-15 NOTE — Telephone Encounter (Signed)
Lmtcb/ww  

## 2018-11-21 ENCOUNTER — Encounter: Payer: Self-pay | Admitting: Nurse Practitioner

## 2018-11-21 ENCOUNTER — Ambulatory Visit (INDEPENDENT_AMBULATORY_CARE_PROVIDER_SITE_OTHER): Payer: Managed Care, Other (non HMO) | Admitting: Nurse Practitioner

## 2018-11-21 VITALS — BP 128/70 | HR 75 | Temp 97.6°F | Ht 65.0 in | Wt 185.0 lb

## 2018-11-21 DIAGNOSIS — Z0001 Encounter for general adult medical examination with abnormal findings: Secondary | ICD-10-CM

## 2018-11-21 DIAGNOSIS — Z6829 Body mass index (BMI) 29.0-29.9, adult: Secondary | ICD-10-CM | POA: Diagnosis not present

## 2018-11-21 DIAGNOSIS — E782 Mixed hyperlipidemia: Secondary | ICD-10-CM

## 2018-11-21 DIAGNOSIS — Z0000 Encounter for general adult medical examination without abnormal findings: Secondary | ICD-10-CM

## 2018-11-21 DIAGNOSIS — I10 Essential (primary) hypertension: Secondary | ICD-10-CM

## 2018-11-21 MED ORDER — LISINOPRIL-HYDROCHLOROTHIAZIDE 20-12.5 MG PO TABS: 1.0000 | ORAL_TABLET | Freq: Every day | ORAL | 1 refills | Status: DC

## 2018-11-21 NOTE — Progress Notes (Signed)
Subjective:    Patient ID: Ariana Dillon, female    DOB: 1946-09-02, 73 y.o.   MRN: [id number]   Chief Complaint: annual physical exam no pap  HPI:  1. Essential hypertension, benign  No c/o chest pain, sob or headache.does not check blood pressure at home. BP Readings from Last 3 Encounters:  11/30/17 108/66  11/24/16 119/70  02/18/16 107/66     2. Mixed hyperlipidemia  Does not watch diet and does not exercises  3. BMI 29.0-29.9,adult  No recent weight changes    Outpatient Encounter Medications as of 11/21/2018  Medication Sig  . lisinopril-hydrochlorothiazide (PRINZIDE,ZESTORETIC) 20-12.5 MG tablet Take 1 tablet by mouth daily.  Marland Kitchen lisinopril-hydrochlorothiazide (PRINZIDE,ZESTORETIC) 20-12.5 MG tablet TAKE 1 TABLET BY MOUTH EVERY DAY       New complaints: None today  Social history: Lives with husband of 30 years  Review of Systems  Constitutional: Negative for activity change and appetite change.  HENT: Negative.   Eyes: Negative for pain.  Respiratory: Negative for shortness of breath.   Cardiovascular: Negative for chest pain, palpitations and leg swelling.  Gastrointestinal: Negative for abdominal pain.  Endocrine: Negative for polydipsia.  Genitourinary: Negative.   Skin: Negative for rash.  Neurological: Negative for dizziness, weakness and headaches.  Hematological: Does not bruise/bleed easily.  Psychiatric/Behavioral: Negative.   All other systems reviewed and are negative.      Objective:   Physical Exam Vitals signs and nursing note reviewed.  Constitutional:      General: She is not in acute distress.    Appearance: Normal appearance. She is well-developed.  HENT:     Head: Normocephalic.     Nose: Nose normal.  Eyes:     Pupils: Pupils are equal, round, and reactive to light.  Neck:     Musculoskeletal: Normal range of motion and neck supple.     Vascular: No carotid bruit or JVD.  Cardiovascular:     Rate and Rhythm: Normal rate  and regular rhythm.     Heart sounds: Normal heart sounds.  Pulmonary:     Effort: Pulmonary effort is normal. No respiratory distress.     Breath sounds: Normal breath sounds. No wheezing or rales.  Chest:     Chest wall: No tenderness.     Breasts: Breasts are symmetrical.        Right: Normal. No swelling, bleeding, inverted nipple, mass, nipple discharge, skin change or tenderness.        Left: Normal. No swelling, bleeding, inverted nipple, mass, nipple discharge, skin change or tenderness.  Abdominal:     General: Bowel sounds are normal. There is no distension or abdominal bruit.     Palpations: Abdomen is soft. There is no hepatomegaly, splenomegaly, mass or pulsatile mass.     Tenderness: There is no abdominal tenderness.  Musculoskeletal: Normal range of motion.     Right lower leg: Edema (mild ankle edema) present.     Left lower leg: Edema (mild ankle edema) present.  Lymphadenopathy:     Cervical: No cervical adenopathy.     Upper Body:     Right upper body: No axillary adenopathy.     Left upper body: No axillary adenopathy.  Skin:    General: Skin is warm and dry.  Neurological:     Mental Status: She is alert and oriented to person, place, and time.     Deep Tendon Reflexes: Reflexes are normal and symmetric.  Psychiatric:  Behavior: Behavior normal.        Thought Content: Thought content normal.        Judgment: Judgment normal.    BP 128/70   Pulse 75   Temp 97.6 F (36.4 C) (Oral)   Ht _0  (1.651 m)   Wt 185 lb (83.9 kg)   BMI 30.79 kg/m       Assessment & Plan:  Ariana Dillon comes in today with chief complaint of Medical Management of Chronic Issues   Diagnosis and orders addressed:  1. Essential hypertension, benign Low sodium diet - CMP14+EGFR - lisinopril-hydrochlorothiazide (PRINZIDE,ZESTORETIC) 20-12.5 MG tablet; Take 1 tablet by mouth daily.  Dispense: 90 tablet; Refill: 1  2. Mixed hyperlipidemia Low fat diet - Lipid  panel  3. BMI 29.0-29.9,adult Discussed diet and exercise for person with BMI >25 Will recheck weight in 3-6 months  4. Annual physical exam - CBC with Differential/Platelet - Thyroid Panel With TSH   Labs pending Health Maintenance reviewed Diet and exercise encouraged  Follow up plan: 6 months   Mary-Margaret Hassell Done, FNP

## 2018-11-21 NOTE — Addendum Note (Signed)
Addended by: Bennie Pierini on: 11/21/2018 11:22 AM   Modules accepted: Level of Service

## 2018-11-21 NOTE — Patient Instructions (Signed)

## 2018-11-22 LAB — CBC WITH DIFFERENTIAL/PLATELET
BASOS ABS: 0.1 10*3/uL (ref 0.0–0.2)
BASOS: 1 %
EOS (ABSOLUTE): 0.1 10*3/uL (ref 0.0–0.4)
EOS: 1 %
HEMATOCRIT: 37.1 % (ref 34.0–46.6)
HEMOGLOBIN: 12.6 g/dL (ref 11.1–15.9)
IMMATURE GRANS (ABS): 0 10*3/uL (ref 0.0–0.1)
Immature Granulocytes: 0 %
LYMPHS ABS: 1.9 10*3/uL (ref 0.7–3.1)
LYMPHS: 25 %
MCH: 29.8 pg (ref 26.6–33.0)
MCHC: 34 g/dL (ref 31.5–35.7)
MCV: 88 fL (ref 79–97)
MONOCYTES: 7 %
Monocytes Absolute: 0.5 10*3/uL (ref 0.1–0.9)
NEUTROS ABS: 4.9 10*3/uL (ref 1.4–7.0)
Neutrophils: 66 %
Platelets: 188 10*3/uL (ref 150–450)
RBC: 4.23 x10E6/uL (ref 3.77–5.28)
RDW: 12.3 % (ref 11.7–15.4)
WBC: 7.4 10*3/uL (ref 3.4–10.8)

## 2018-11-22 LAB — CMP14+EGFR
A/G RATIO: 1.7 (ref 1.2–2.2)
ALBUMIN: 4.1 g/dL (ref 3.7–4.7)
ALT: 12 IU/L (ref 0–32)
AST: 17 IU/L (ref 0–40)
Alkaline Phosphatase: 87 IU/L (ref 39–117)
BILIRUBIN TOTAL: 0.4 mg/dL (ref 0.0–1.2)
BUN / CREAT RATIO: 21 (ref 12–28)
BUN: 22 mg/dL (ref 8–27)
CHLORIDE: 106 mmol/L (ref 96–106)
CO2: 22 mmol/L (ref 20–29)
Calcium: 9.7 mg/dL (ref 8.7–10.3)
Creatinine, Ser: 1.06 mg/dL — ABNORMAL HIGH (ref 0.57–1.00)
GFR, EST AFRICAN AMERICAN: 61 mL/min/{1.73_m2} (ref >59)
GFR, EST NON AFRICAN AMERICAN: 53 mL/min/{1.73_m2} — AB (ref >59)
GLOBULIN, TOTAL: 2.4 g/dL (ref 1.5–4.5)
GLUCOSE: 95 mg/dL (ref 65–99)
POTASSIUM: 4.2 mmol/L (ref 3.5–5.2)
SODIUM: 143 mmol/L (ref 134–144)
TOTAL PROTEIN: 6.5 g/dL (ref 6.0–8.5)

## 2018-11-22 LAB — LIPID PANEL
Chol/HDL Ratio: 3.8 ratio (ref 0.0–4.4)
Cholesterol, Total: 173 mg/dL (ref 100–199)
HDL: 45 mg/dL (ref >39)
LDL Calculated: 109 mg/dL — ABNORMAL HIGH (ref 0–99)
Triglycerides: 97 mg/dL (ref 0–149)
VLDL Cholesterol Cal: 19 mg/dL (ref 5–40)

## 2018-11-22 LAB — THYROID PANEL WITH TSH
FREE THYROXINE INDEX: 2 (ref 1.2–4.9)
T3 UPTAKE RATIO: 27 % (ref 24–39)
T4, Total: 7.5 ug/dL (ref 4.5–12.0)
TSH: 1.55 u[IU]/mL (ref 0.450–4.500)

## 2019-05-15 ENCOUNTER — Other Ambulatory Visit: Payer: Self-pay | Admitting: Nurse Practitioner

## 2019-05-15 DIAGNOSIS — I10 Essential (primary) hypertension: Secondary | ICD-10-CM

## 2019-06-08 ENCOUNTER — Other Ambulatory Visit: Payer: Self-pay | Admitting: Nurse Practitioner

## 2019-06-08 DIAGNOSIS — I10 Essential (primary) hypertension: Secondary | ICD-10-CM

## 2019-06-10 NOTE — Telephone Encounter (Signed)
MMM NTBS 30 days given 05/15/19

## 2019-07-03 ENCOUNTER — Other Ambulatory Visit: Payer: Self-pay | Admitting: Nurse Practitioner

## 2019-07-03 DIAGNOSIS — I10 Essential (primary) hypertension: Secondary | ICD-10-CM

## 2019-07-10 LAB — HM MAMMOGRAPHY

## 2019-07-22 ENCOUNTER — Ambulatory Visit: Payer: Managed Care, Other (non HMO) | Admitting: Nurse Practitioner

## 2019-07-23 ENCOUNTER — Other Ambulatory Visit: Payer: Self-pay

## 2019-07-24 ENCOUNTER — Ambulatory Visit (INDEPENDENT_AMBULATORY_CARE_PROVIDER_SITE_OTHER): Payer: Managed Care, Other (non HMO)

## 2019-07-24 ENCOUNTER — Encounter: Payer: Self-pay | Admitting: Nurse Practitioner

## 2019-07-24 ENCOUNTER — Ambulatory Visit (INDEPENDENT_AMBULATORY_CARE_PROVIDER_SITE_OTHER): Payer: Managed Care, Other (non HMO) | Admitting: Nurse Practitioner

## 2019-07-24 VITALS — BP 130/71 | HR 75 | Temp 96.2°F | Resp 16 | Ht 65.0 in | Wt 186.0 lb

## 2019-07-24 DIAGNOSIS — I10 Essential (primary) hypertension: Secondary | ICD-10-CM

## 2019-07-24 DIAGNOSIS — Z23 Encounter for immunization: Secondary | ICD-10-CM

## 2019-07-24 DIAGNOSIS — E782 Mixed hyperlipidemia: Secondary | ICD-10-CM | POA: Diagnosis not present

## 2019-07-24 DIAGNOSIS — R011 Cardiac murmur, unspecified: Secondary | ICD-10-CM | POA: Diagnosis not present

## 2019-07-24 DIAGNOSIS — Z6830 Body mass index (BMI) 30.0-30.9, adult: Secondary | ICD-10-CM

## 2019-07-24 NOTE — Patient Instructions (Signed)

## 2019-07-24 NOTE — Progress Notes (Signed)
Subjective:    Patient ID: Ariana Dillon, female    DOB: 03/20/1946, 73 y.o.   MRN: [id number]   Chief Complaint: Medical Management of Chronic Issues    HPI:  1. Essential hypertension, benign No c/o chest pain, sob or headache. Does not check blood pressure at home. BP Readings from Last 3 Encounters:  07/24/19 130/71  11/21/18 128/70  11/30/17 108/66     2. Mixed hyperlipidemia Does avoid fried foods. Does not do much exercise. Lab Results  Component Value Date   CHOL 173 11/21/2018   HDL 45 11/21/2018   LDLCALC 109 (H) 11/21/2018   TRIG 97 11/21/2018   CHOLHDL 3.8 11/21/2018     3. BMI 29.0-29.9,adult No recent weight changes Wt Readings from Last 3 Encounters:  07/24/19 186 lb (84.4 kg)  11/21/18 185 lb (83.9 kg)  11/30/17 183 lb (83 kg)   BMI Readings from Last 3 Encounters:  07/24/19 30.95 kg/m  11/21/18 30.79 kg/m  11/30/17 30.45 kg/m     4. SYSTOLIC MURMUR No problems    Outpatient Encounter Medications as of 07/24/2019  Medication Sig  . lisinopril-hydrochlorothiazide (ZESTORETIC) 20-12.5 MG tablet Take 1 tablet by mouth daily.     History reviewed. No pertinent surgical history.  Family History  Problem Relation Age of Onset  . Diabetes Mother   . Alzheimer's disease Mother   . Cancer Father        lung  . Polymyositis Sister   . Heart disease Brother   . Heart disease Brother   . Heart disease Brother     New complaints: None today  Social history: Is retiring Friday of this week  Controlled substance contract: n/a    Review of Systems  Constitutional: Negative for activity change and appetite change.  HENT: Negative.   Eyes: Negative for pain.  Respiratory: Negative for shortness of breath.   Cardiovascular: Negative for chest pain, palpitations and leg swelling.  Gastrointestinal: Negative for abdominal pain.  Endocrine: Negative for polydipsia.  Genitourinary: Negative.   Skin: Negative for rash.  Neurological:  Negative for dizziness, weakness and headaches.  Hematological: Does not bruise/bleed easily.  Psychiatric/Behavioral: Negative.   All other systems reviewed and are negative.      Objective:   Physical Exam Vitals signs and nursing note reviewed.  Constitutional:      General: She is not in acute distress.    Appearance: Normal appearance. She is well-developed.  HENT:     Head: Normocephalic.     Nose: Nose normal.  Eyes:     Pupils: Pupils are equal, round, and reactive to light.  Neck:     Musculoskeletal: Normal range of motion and neck supple.     Vascular: No carotid bruit or JVD.  Cardiovascular:     Rate and Rhythm: Normal rate and regular rhythm.     Heart sounds: Normal heart sounds.  Pulmonary:     Effort: Pulmonary effort is normal. No respiratory distress.     Breath sounds: Normal breath sounds. No wheezing or rales.  Chest:     Chest wall: No tenderness.  Abdominal:     General: Bowel sounds are normal. There is no distension or abdominal bruit.     Palpations: Abdomen is soft. There is no hepatomegaly, splenomegaly, mass or pulsatile mass.     Tenderness: There is no abdominal tenderness.  Musculoskeletal: Normal range of motion.     Right lower leg: Edema (1+) present.     Left lower  leg: Edema (1+) present.  Lymphadenopathy:     Cervical: No cervical adenopathy.  Skin:    General: Skin is warm and dry.  Neurological:     Mental Status: She is alert and oriented to person, place, and time.     Deep Tendon Reflexes: Reflexes are normal and symmetric.  Psychiatric:        Behavior: Behavior normal.        Thought Content: Thought content normal.        Judgment: Judgment normal.    BP 130/71   Pulse 75   Temp (!) 96.2 F (35.7 C) (Temporal)   Resp 16   Ht '5\' 5"'$  (1.651 m)   Wt 186 lb (84.4 kg)   SpO2 96%   BMI 30.95 kg/m    Chest xray normal EKG- NSR    Assessment & Plan:  Aylin Rhoads comes in today with chief complaint of Medical  Management of Chronic Issues   Diagnosis and orders addressed:  1. Essential hypertension, benign Low sodium diet - CMP14+EGFR - DG Chest 2 View; Future - EKG 12-Lead  2. Mixed hyperlipidemia Low fat diet - Lipid panel  3. BMI 30.0-30.9,adult Discussed diet and exercise for person with BMI >25 Will recheck weight in 3-6 months   4. SYSTOLIC MURMUR   Labs pending Health Maintenance reviewed Diet and exercise encouraged  Follow up plan: 6 months   Mary-Margaret Hassell Done, FNP

## 2019-07-26 LAB — LIPID PANEL
Chol/HDL Ratio: 4.3 ratio (ref 0.0–4.4)
Cholesterol, Total: 185 mg/dL (ref 100–199)
HDL: 43 mg/dL (ref >39)
LDL Chol Calc (NIH): 116 mg/dL — ABNORMAL HIGH (ref 0–99)
Triglycerides: 148 mg/dL (ref 0–149)
VLDL Cholesterol Cal: 26 mg/dL (ref 5–40)

## 2019-07-26 LAB — CMP14+EGFR
ALT: 10 IU/L (ref 0–32)
AST: 14 IU/L (ref 0–40)
Albumin/Globulin Ratio: 1.6 (ref 1.2–2.2)
Albumin: 4.2 g/dL (ref 3.7–4.7)
Alkaline Phosphatase: 104 IU/L (ref 39–117)
BUN/Creatinine Ratio: 27 (ref 12–28)
BUN: 30 mg/dL — ABNORMAL HIGH (ref 8–27)
Bilirubin Total: 0.2 mg/dL (ref 0.0–1.2)
CO2: 27 mmol/L (ref 20–29)
Calcium: 10 mg/dL (ref 8.7–10.3)
Chloride: 104 mmol/L (ref 96–106)
Creatinine, Ser: 1.1 mg/dL — ABNORMAL HIGH (ref 0.57–1.00)
GFR calc Af Amer: 58 mL/min/{1.73_m2} — ABNORMAL LOW (ref >59)
GFR calc non Af Amer: 50 mL/min/{1.73_m2} — ABNORMAL LOW (ref >59)
Globulin, Total: 2.6 g/dL (ref 1.5–4.5)
Glucose: 118 mg/dL — ABNORMAL HIGH (ref 65–99)
Potassium: 4 mmol/L (ref 3.5–5.2)
Sodium: 144 mmol/L (ref 134–144)
Total Protein: 6.8 g/dL (ref 6.0–8.5)

## 2019-07-28 ENCOUNTER — Other Ambulatory Visit: Payer: Self-pay | Admitting: Nurse Practitioner

## 2019-07-28 DIAGNOSIS — I10 Essential (primary) hypertension: Secondary | ICD-10-CM

## 2019-09-03 ENCOUNTER — Encounter: Payer: Self-pay | Admitting: Family Medicine

## 2019-09-03 ENCOUNTER — Ambulatory Visit (INDEPENDENT_AMBULATORY_CARE_PROVIDER_SITE_OTHER): Payer: Medicare Other | Admitting: Family Medicine

## 2019-09-03 DIAGNOSIS — U071 COVID-19: Secondary | ICD-10-CM

## 2019-09-03 MED ORDER — AZITHROMYCIN 250 MG PO TABS: ORAL_TABLET | ORAL | 0 refills | Status: DC

## 2019-09-03 MED ORDER — HYDROXYCHLOROQUINE SULFATE 200 MG PO TABS: 400.0000 mg | ORAL_TABLET | Freq: Two times a day (BID) | ORAL | 0 refills | Status: DC

## 2019-09-03 NOTE — Progress Notes (Signed)
Subjective:    Patient ID: Ariana Dillon, female    DOB: 1945/12/29, 73 y.o.   MRN: [id number]   HPI: Ariana Dillon is a 74 y.o. female presenting for dx of COVID. Has been very sick. Fever to 100.4. Coughed so much her ribs are sore. Pt. Denies dyspnea. Having diarrhea as well. Everything tastes bad. Drinking a lot of juice to avoid dehydration. Was at a small gathering for a funeral for her brother a couple of weeks ago and everyone that went got COVID.Her symptoms started 9 days ago.   Depression screen [facility] 2/9 07/24/2019 11/21/2018 11/30/2017 11/24/2016 02/18/2016  Decreased Interest 0 0 0 0 0  Down, Depressed, Hopeless 0 0 0 0 0  PHQ - 2 Score 0 0 0 0 0     Relevant past medical, surgical, family and social history reviewed and updated as indicated.  Interim medical history since our last visit reviewed. Allergies and medications reviewed and updated.  ROS:  Review of Systems  Constitutional: Positive for activity change (staying in bed a lot), fatigue and fever.  HENT: Positive for congestion (loss of sense of taste) and sore throat. Negative for hearing loss.      Social History   Tobacco Use  Smoking Status Never Smoker  Smokeless Tobacco Never Used       Objective:     Wt Readings from Last 3 Encounters:  07/24/19 186 lb (84.4 kg)  11/21/18 185 lb (83.9 kg)  11/30/17 183 lb (83 kg)     Exam deferred. Pt. Harboring due to COVID 19. Phone visit performed.   Assessment & Plan:   1. COVID-19 virus infection     Meds ordered this encounter  Medications  . hydroxychloroquine (PLAQUENIL) 200 MG tablet    Sig: Take 2 tablets (400 mg total) by mouth 2 (two) times daily.    Dispense:  40 tablet    Refill:  0  . azithromycin (ZITHROMAX Z-PAK) 250 MG tablet    Sig: Take two right away Then one a day for the next 4 days.    Dispense:  6 each    Refill:  0    No orders of the defined types were placed in this encounter.     Diagnoses and all orders for this  visit:  COVID-19 virus infection  Other orders -     hydroxychloroquine (PLAQUENIL) 200 MG tablet; Take 2 tablets (400 mg total) by mouth 2 (two) times daily. -     azithromycin (ZITHROMAX Z-PAK) 250 MG tablet; Take two right away Then one a day for the next 4 days.    Virtual Visit via telephone Note  I discussed the limitations, risks, security and privacy concerns of performing an evaluation and management service by telephone and the availability of in person appointments. The patient was identified with two identifiers. Pt.expressed understanding and agreed to proceed. Pt. Is at home. Dr. Livia Snellen is in his office.  Follow Up Instructions:   I discussed the assessment and treatment plan with the patient. The patient was provided an opportunity to ask questions and all were answered. The patient agreed with the plan and demonstrated an understanding of the instructions.   The patient was advised to call back or seek an in-person evaluation if the symptoms worsen or if the condition fails to improve as anticipated.   Total minutes including chart review and phone contact time: 13   Follow up plan: No follow-ups on file.  Claretta Fraise, MD Tristan Schroeder  [place]

## 2020-02-08 ENCOUNTER — Other Ambulatory Visit: Payer: Self-pay | Admitting: Nurse Practitioner

## 2020-02-08 DIAGNOSIS — I10 Essential (primary) hypertension: Secondary | ICD-10-CM

## 2020-03-03 ENCOUNTER — Other Ambulatory Visit: Payer: Self-pay | Admitting: Nurse Practitioner

## 2020-03-03 DIAGNOSIS — I10 Essential (primary) hypertension: Secondary | ICD-10-CM

## 2020-03-03 NOTE — Telephone Encounter (Signed)
Patient has appt scheduled for 5/20 and has enough meds until this date.

## 2020-03-03 NOTE — Telephone Encounter (Signed)
MMM NTBS 30 days given 02/10/20

## 2020-03-12 ENCOUNTER — Encounter: Payer: Self-pay | Admitting: Nurse Practitioner

## 2020-03-12 ENCOUNTER — Ambulatory Visit (INDEPENDENT_AMBULATORY_CARE_PROVIDER_SITE_OTHER): Payer: Medicare Other | Admitting: Nurse Practitioner

## 2020-03-12 ENCOUNTER — Ambulatory Visit (INDEPENDENT_AMBULATORY_CARE_PROVIDER_SITE_OTHER): Payer: Medicare Other

## 2020-03-12 ENCOUNTER — Other Ambulatory Visit: Payer: Self-pay

## 2020-03-12 VITALS — BP 114/66 | HR 76 | Temp 97.3°F | Resp 20 | Ht 65.0 in | Wt 167.0 lb

## 2020-03-12 DIAGNOSIS — U071 COVID-19: Secondary | ICD-10-CM

## 2020-03-12 DIAGNOSIS — I10 Essential (primary) hypertension: Secondary | ICD-10-CM | POA: Diagnosis not present

## 2020-03-12 DIAGNOSIS — E782 Mixed hyperlipidemia: Secondary | ICD-10-CM

## 2020-03-12 DIAGNOSIS — Z6829 Body mass index (BMI) 29.0-29.9, adult: Secondary | ICD-10-CM | POA: Diagnosis not present

## 2020-03-12 DIAGNOSIS — Z8616 Personal history of COVID-19: Secondary | ICD-10-CM | POA: Diagnosis not present

## 2020-03-12 DIAGNOSIS — B342 Coronavirus infection, unspecified: Secondary | ICD-10-CM | POA: Diagnosis not present

## 2020-03-12 MED ORDER — LISINOPRIL-HYDROCHLOROTHIAZIDE 20-12.5 MG PO TABS: 1.0000 | ORAL_TABLET | Freq: Every day | ORAL | 1 refills | Status: DC

## 2020-03-12 NOTE — Patient Instructions (Signed)
DASH Eating Plan DASH stands for "Dietary Approaches to Stop Hypertension." The DASH eating plan is a healthy eating plan that has been shown to reduce high blood pressure (hypertension). It may also reduce your risk for type 2 diabetes, heart disease, and stroke. The DASH eating plan may also help with weight loss. What are tips for following this plan?  General guidelines  Avoid eating more than 2,300 mg (milligrams) of salt (sodium) a day. If you have hypertension, you may need to reduce your sodium intake to 1,500 mg a day.  Limit alcohol intake to no more than 1 drink a day for nonpregnant women and 2 drinks a day for men. One drink equals 12 oz of beer, 5 oz of wine, or 1 oz of hard liquor.  Work with your health care provider to maintain a healthy body weight or to lose weight. Ask what an ideal weight is for you.  Get at least 30 minutes of exercise that causes your heart to beat faster (aerobic exercise) most days of the week. Activities may include walking, swimming, or biking.  Work with your health care provider or diet and nutrition specialist (dietitian) to adjust your eating plan to your individual calorie needs. Reading food labels   Check food labels for the amount of sodium per serving. Choose foods with less than 5 percent of the Daily Value of sodium. Generally, foods with less than 300 mg of sodium per serving fit into this eating plan.  To find whole grains, look for the word "whole" as the first word in the ingredient list. Shopping  Buy products labeled as "low-sodium" or "no salt added."  Buy fresh foods. Avoid canned foods and premade or frozen meals. Cooking  Avoid adding salt when cooking. Use salt-free seasonings or herbs instead of table salt or sea salt. Check with your health care provider or pharmacist before using salt substitutes.  Do not fry foods. Cook foods using healthy methods such as baking, boiling, grilling, and broiling instead.  Cook with  heart-healthy oils, such as olive, canola, soybean, or sunflower oil. Meal planning  Eat a balanced diet that includes: ? 5 or more servings of fruits and vegetables each day. At each meal, try to fill half of your plate with fruits and vegetables. ? Up to 6-8 servings of whole grains each day. ? Less than 6 oz of lean meat, poultry, or fish each day. A 3-oz serving of meat is about the same size as a deck of cards. One egg equals 1 oz. ? 2 servings of low-fat dairy each day. ? A serving of nuts, seeds, or beans 5 times each week. ? Heart-healthy fats. Healthy fats called Omega-3 fatty acids are found in foods such as flaxseeds and coldwater fish, like sardines, salmon, and mackerel.  Limit how much you eat of the following: ? Canned or prepackaged foods. ? Food that is high in trans fat, such as fried foods. ? Food that is high in saturated fat, such as fatty meat. ? Sweets, desserts, sugary drinks, and other foods with added sugar. ? Full-fat dairy products.  Do not salt foods before eating.  Try to eat at least 2 vegetarian meals each week.  Eat more home-cooked food and less restaurant, buffet, and fast food.  When eating at a restaurant, ask that your food be prepared with less salt or no salt, if possible. What foods are recommended? The items listed may not be a complete list. Talk with your dietitian about   what dietary choices are best for you. Grains Whole-grain or whole-wheat bread. Whole-grain or whole-wheat pasta. Brown rice. Oatmeal. Quinoa. Bulgur. Whole-grain and low-sodium cereals. Pita bread. Low-fat, low-sodium crackers. Whole-wheat flour tortillas. Vegetables Fresh or frozen vegetables (raw, steamed, roasted, or grilled). Low-sodium or reduced-sodium tomato and vegetable juice. Low-sodium or reduced-sodium tomato sauce and tomato paste. Low-sodium or reduced-sodium canned vegetables. Fruits All fresh, dried, or frozen fruit. Canned fruit in natural juice (without  added sugar). Meat and other protein foods Skinless chicken or turkey. Ground chicken or turkey. Pork with fat trimmed off. Fish and seafood. Egg whites. Dried beans, peas, or lentils. Unsalted nuts, nut butters, and seeds. Unsalted canned beans. Lean cuts of beef with fat trimmed off. Low-sodium, lean deli meat. Dairy Low-fat (1%) or fat-free (skim) milk. Fat-free, low-fat, or reduced-fat cheeses. Nonfat, low-sodium ricotta or cottage cheese. Low-fat or nonfat yogurt. Low-fat, low-sodium cheese. Fats and oils Soft margarine without trans fats. Vegetable oil. Low-fat, reduced-fat, or light mayonnaise and salad dressings (reduced-sodium). Canola, safflower, olive, soybean, and sunflower oils. Avocado. Seasoning and other foods Herbs. Spices. Seasoning mixes without salt. Unsalted popcorn and pretzels. Fat-free sweets. What foods are not recommended? The items listed may not be a complete list. Talk with your dietitian about what dietary choices are best for you. Grains Baked goods made with fat, such as croissants, muffins, or some breads. Dry pasta or rice meal packs. Vegetables Creamed or fried vegetables. Vegetables in a cheese sauce. Regular canned vegetables (not low-sodium or reduced-sodium). Regular canned tomato sauce and paste (not low-sodium or reduced-sodium). Regular tomato and vegetable juice (not low-sodium or reduced-sodium). Pickles. Olives. Fruits Canned fruit in a light or heavy syrup. Fried fruit. Fruit in cream or butter sauce. Meat and other protein foods Fatty cuts of meat. Ribs. Fried meat. Bacon. Sausage. Bologna and other processed lunch meats. Salami. Fatback. Hotdogs. Bratwurst. Salted nuts and seeds. Canned beans with added salt. Canned or smoked fish. Whole eggs or egg yolks. Chicken or turkey with skin. Dairy Whole or 2% milk, cream, and half-and-half. Whole or full-fat cream cheese. Whole-fat or sweetened yogurt. Full-fat cheese. Nondairy creamers. Whipped toppings.  Processed cheese and cheese spreads. Fats and oils Butter. Stick margarine. Lard. Shortening. Ghee. Bacon fat. Tropical oils, such as coconut, palm kernel, or palm oil. Seasoning and other foods Salted popcorn and pretzels. Onion salt, garlic salt, seasoned salt, table salt, and sea salt. Worcestershire sauce. Tartar sauce. Barbecue sauce. Teriyaki sauce. Soy sauce, including reduced-sodium. Steak sauce. Canned and packaged gravies. Fish sauce. Oyster sauce. Cocktail sauce. Horseradish that you find on the shelf. Ketchup. Mustard. Meat flavorings and tenderizers. Bouillon cubes. Hot sauce and Tabasco sauce. Premade or packaged marinades. Premade or packaged taco seasonings. Relishes. Regular salad dressings. Where to find more information:  National Heart, Lung, and Blood Institute: www.nhlbi.nih.gov  American Heart Association: www.heart.org Summary  The DASH eating plan is a healthy eating plan that has been shown to reduce high blood pressure (hypertension). It may also reduce your risk for type 2 diabetes, heart disease, and stroke.  With the DASH eating plan, you should limit salt (sodium) intake to 2,300 mg a day. If you have hypertension, you may need to reduce your sodium intake to 1,500 mg a day.  When on the DASH eating plan, aim to eat more fresh fruits and vegetables, whole grains, lean proteins, low-fat dairy, and heart-healthy fats.  Work with your health care provider or diet and nutrition specialist (dietitian) to adjust your eating plan to your   individual calorie needs. This information is not intended to replace advice given to you by your health care provider. Make sure you discuss any questions you have with your health care provider. Document Revised: 09/22/2017 Document Reviewed: 10/03/2016 Elsevier Patient Education  2020 Elsevier Inc.  

## 2020-03-12 NOTE — Progress Notes (Signed)
Subjective:    Patient ID: Ariana Dillon, female    DOB: 21-Oct-1946, 74 y.o.   MRN: [id number]   Chief Complaint: Medical Management of Chronic Issues    HPI:  1. Essential hypertension, benign no c/o chset pain, ob or headache. Does  check blood pressure at home.130's systolic BP Readings from Last 3 Encounters:  03/12/20 (!) 114/6  07/24/19 130/71  11/21/18 128/70     2. Mixed hyperlipidemia Does not watch diet and doe very little exercise Lab Results  Component Value Date   CHOL 185 07/24/2019   HDL 43 07/24/2019   LDLCALC 116 (H) 07/24/2019   TRIG 148 07/24/2019   CHOLHDL 4.3 07/24/2019      3. BMI 29.0-29.9,adult weight down 19lbs- from having covid and the death of her husband. Wt Readings from Last 3 Encounters:  03/12/20 167 lb (75.8 kg)  07/24/19 186 lb (84.4 kg)  11/21/18 185 lb (83.9 kg)   BMI Readings from Last 3 Encounters:  03/12/20 27.79 kg/m  07/24/19 30.95 kg/m  11/21/18 30.79 kg/m       Outpatient Encounter Medications as of 03/12/2020  Medication Sig  . lisinopril-hydrochlorothiazide (ZESTORETIC) 20-12.5 MG tablet Take 1 tablet by mouth daily. (Needs to be seen before next refill)     History reviewed. No pertinent surgical history.  Family History  Problem Relation Age of Onset  . Diabetes Mother   . Alzheimer's disease Mother   . Cancer Father        lung  . Polymyositis Sister   . Heart disease Brother   . Heart disease Brother   . Heart disease Brother     New complaints: None today  Social history: husband died of covid late lat year  Controlled substance contract: n/a   Review of Systems  Constitutional: Negative for diaphoresis.  Eyes: Negative for pain.  Respiratory: Negative for shortness of breath.   Cardiovascular: Negative for chest pain, palpitations and leg swelling.  Gastrointestinal: Negative for abdominal pain.  Endocrine: Negative for polydipsia.  Skin: Negative for rash.  Neurological:  Negative for dizziness, weakness and headaches.  Hematological: Does not bruise/bleed easily.  All other systems reviewed and are negative.      Objective:   Physical Exam Vitals and nursing note reviewed.  Constitutional:      General: She is not in acute distress.    Appearance: Normal appearance. She is well-developed.  HENT:     Head: Normocephalic.     Nose: Nose normal.  Eyes:     Pupils: Pupils are equal, round, and reactive to light.  Neck:     Vascular: No carotid bruit or JVD.  Cardiovascular:     Rate and Rhythm: Normal rate and regular rhythm.     Heart sounds: Normal heart sounds.  Pulmonary:     Effort: Pulmonary effort is normal. No respiratory distress.     Breath sounds: Normal breath sounds. No wheezing or rales.  Chest:     Chest wall: No tenderness.  Abdominal:     General: Bowel sounds are normal. There is no distension or abdominal bruit.     Palpations: Abdomen is soft. There is no hepatomegaly, splenomegaly, mass or pulsatile mass.     Tenderness: There is no abdominal tenderness.  Musculoskeletal:        General: Normal range of motion.     Cervical back: Normal range of motion and neck supple.  Lymphadenopathy:     Cervical: No cervical adenopathy.  Skin:  General: Skin is warm and dry.  Neurological:     Mental Status: She is alert and oriented to person, place, and time.     Deep Tendon Reflexes: Reflexes are normal and symmetric.  Psychiatric:        Behavior: Behavior normal.        Thought Content: Thought content normal.        Judgment: Judgment normal.    BP 114/66   Pulse 76   Temp (!) 97.3 F (36.3 C) (Temporal)   Resp 20   Ht 5\' 5"  (1.651 m)   Wt 167 lb (75.8 kg)   SpO2 98%   BMI 27.79 kg/m           Assessment & Plan:  Ariana Dillon comes in today with chief complaint of Medical Management of Chronic Issues   Diagnosis and orders addressed:  1. Essential hypertension, benign Low sodium diet .- DG Chest 2  View; Future - lisinopril-hydrochlorothiazide (ZESTORETIC) 20-12.5 MG tablet; Take 1 tablet by mouth daily. (Needs to be seen before next refill)  Dispense: 90 tablet; Refill: 1  2. Mixed hyperlipidemia Low fat diet  3. BMI 29.0-29.9,adult Discussed diet and exercise for person with BMI >25 Will recheck weight in 3-6 months    Labs pending Health Maintenance reviewed Diet and exercise encouraged  Follow up plan: 6 months   Mary-Margaret Hassell Done, FNP

## 2020-03-12 NOTE — Addendum Note (Signed)
Addended by: Cleda Daub on: 03/12/2020 10:10 AM   Modules accepted: Orders

## 2020-03-13 LAB — LIPID PANEL
Chol/HDL Ratio: 3.7 ratio (ref 0.0–4.4)
Cholesterol, Total: 180 mg/dL (ref 100–199)
HDL: 49 mg/dL (ref >39)
LDL Chol Calc (NIH): 115 mg/dL — ABNORMAL HIGH (ref 0–99)
Triglycerides: 85 mg/dL (ref 0–149)
VLDL Cholesterol Cal: 16 mg/dL (ref 5–40)

## 2020-03-13 LAB — CBC WITH DIFFERENTIAL/PLATELET
Basophils Absolute: 0.1 10*3/uL (ref 0.0–0.2)
Basos: 1 %
EOS (ABSOLUTE): 0.1 10*3/uL (ref 0.0–0.4)
Eos: 1 %
Hematocrit: 39.1 % (ref 34.0–46.6)
Hemoglobin: 13.2 g/dL (ref 11.1–15.9)
Immature Grans (Abs): 0 10*3/uL (ref 0.0–0.1)
Immature Granulocytes: 1 %
Lymphocytes Absolute: 1.7 10*3/uL (ref 0.7–3.1)
Lymphs: 20 %
MCH: 30.3 pg (ref 26.6–33.0)
MCHC: 33.8 g/dL (ref 31.5–35.7)
MCV: 90 fL (ref 79–97)
Monocytes Absolute: 0.6 10*3/uL (ref 0.1–0.9)
Monocytes: 7 %
Neutrophils Absolute: 6 10*3/uL (ref 1.4–7.0)
Neutrophils: 70 %
Platelets: 173 10*3/uL (ref 150–450)
RBC: 4.35 x10E6/uL (ref 3.77–5.28)
RDW: 13 % (ref 11.7–15.4)
WBC: 8.5 10*3/uL (ref 3.4–10.8)

## 2020-03-13 LAB — CMP14+EGFR
ALT: 8 IU/L (ref 0–32)
AST: 14 IU/L (ref 0–40)
Albumin/Globulin Ratio: 1.8 (ref 1.2–2.2)
Albumin: 4.5 g/dL (ref 3.7–4.7)
Alkaline Phosphatase: 96 IU/L (ref 48–121)
BUN/Creatinine Ratio: 26 (ref 12–28)
BUN: 24 mg/dL (ref 8–27)
Bilirubin Total: 0.3 mg/dL (ref 0.0–1.2)
CO2: 26 mmol/L (ref 20–29)
Calcium: 10.1 mg/dL (ref 8.7–10.3)
Chloride: 106 mmol/L (ref 96–106)
Creatinine, Ser: 0.93 mg/dL (ref 0.57–1.00)
GFR calc Af Amer: 71 mL/min/{1.73_m2} (ref >59)
GFR calc non Af Amer: 61 mL/min/{1.73_m2} (ref >59)
Globulin, Total: 2.5 g/dL (ref 1.5–4.5)
Glucose: 102 mg/dL — ABNORMAL HIGH (ref 65–99)
Potassium: 4.4 mmol/L (ref 3.5–5.2)
Sodium: 145 mmol/L — ABNORMAL HIGH (ref 134–144)
Total Protein: 7 g/dL (ref 6.0–8.5)

## 2020-06-15 ENCOUNTER — Other Ambulatory Visit: Payer: Self-pay | Admitting: Nurse Practitioner

## 2020-06-15 DIAGNOSIS — Z1231 Encounter for screening mammogram for malignant neoplasm of breast: Secondary | ICD-10-CM

## 2020-07-22 ENCOUNTER — Ambulatory Visit
Admission: RE | Admit: 2020-07-22 | Discharge: 2020-07-22 | Disposition: A | Discharge status: Home / Self Care | Payer: Medicare Other | Source: Ambulatory Visit | Attending: Nurse Practitioner | Admitting: Nurse Practitioner

## 2020-07-22 ENCOUNTER — Ambulatory Visit (INDEPENDENT_AMBULATORY_CARE_PROVIDER_SITE_OTHER): Payer: Medicare Other | Admitting: *Deleted

## 2020-07-22 ENCOUNTER — Other Ambulatory Visit: Payer: Self-pay

## 2020-07-22 DIAGNOSIS — Z23 Encounter for immunization: Secondary | ICD-10-CM | POA: Diagnosis not present

## 2020-07-22 DIAGNOSIS — Z1231 Encounter for screening mammogram for malignant neoplasm of breast: Secondary | ICD-10-CM | POA: Diagnosis not present

## 2020-09-06 ENCOUNTER — Other Ambulatory Visit: Payer: Self-pay | Admitting: Nurse Practitioner

## 2020-09-06 DIAGNOSIS — I10 Essential (primary) hypertension: Secondary | ICD-10-CM

## 2020-09-14 ENCOUNTER — Other Ambulatory Visit: Payer: Self-pay

## 2020-09-14 ENCOUNTER — Encounter: Payer: Self-pay | Admitting: Nurse Practitioner

## 2020-09-14 ENCOUNTER — Ambulatory Visit (INDEPENDENT_AMBULATORY_CARE_PROVIDER_SITE_OTHER): Payer: Medicare Other | Admitting: Nurse Practitioner

## 2020-09-14 VITALS — BP 138/75 | HR 56 | Temp 97.0°F | Resp 20 | Ht 65.0 in | Wt 171.0 lb

## 2020-09-14 DIAGNOSIS — I10 Essential (primary) hypertension: Secondary | ICD-10-CM

## 2020-09-14 DIAGNOSIS — Z6826 Body mass index (BMI) 26.0-26.9, adult: Secondary | ICD-10-CM | POA: Diagnosis not present

## 2020-09-14 DIAGNOSIS — E782 Mixed hyperlipidemia: Secondary | ICD-10-CM | POA: Diagnosis not present

## 2020-09-14 LAB — CBC WITH DIFFERENTIAL/PLATELET
Basophils Absolute: 0.1 10*3/uL (ref 0.0–0.2)
Basos: 1 %
EOS (ABSOLUTE): 0.1 10*3/uL (ref 0.0–0.4)
Eos: 1 %
Hematocrit: 38.7 % (ref 34.0–46.6)
Hemoglobin: 13.1 g/dL (ref 11.1–15.9)
Immature Grans (Abs): 0 10*3/uL (ref 0.0–0.1)
Immature Granulocytes: 1 %
Lymphocytes Absolute: 1.9 10*3/uL (ref 0.7–3.1)
Lymphs: 24 %
MCH: 30 pg (ref 26.6–33.0)
MCHC: 33.9 g/dL (ref 31.5–35.7)
MCV: 89 fL (ref 79–97)
Monocytes Absolute: 0.7 10*3/uL (ref 0.1–0.9)
Monocytes: 9 %
Neutrophils Absolute: 5.1 10*3/uL (ref 1.4–7.0)
Neutrophils: 64 %
Platelets: 196 10*3/uL (ref 150–450)
RBC: 4.37 x10E6/uL (ref 3.77–5.28)
RDW: 12.1 % (ref 11.7–15.4)
WBC: 7.9 10*3/uL (ref 3.4–10.8)

## 2020-09-14 LAB — CMP14+EGFR
ALT: 12 IU/L (ref 0–32)
AST: 15 IU/L (ref 0–40)
Albumin/Globulin Ratio: 1.8 (ref 1.2–2.2)
Albumin: 4.3 g/dL (ref 3.7–4.7)
Alkaline Phosphatase: 98 IU/L (ref 44–121)
BUN/Creatinine Ratio: 19 (ref 12–28)
BUN: 18 mg/dL (ref 8–27)
Bilirubin Total: 0.3 mg/dL (ref 0.0–1.2)
CO2: 23 mmol/L (ref 20–29)
Calcium: 10.1 mg/dL (ref 8.7–10.3)
Chloride: 104 mmol/L (ref 96–106)
Creatinine, Ser: 0.94 mg/dL (ref 0.57–1.00)
GFR calc Af Amer: 69 mL/min/{1.73_m2} (ref >59)
GFR calc non Af Amer: 60 mL/min/{1.73_m2} (ref >59)
Globulin, Total: 2.4 g/dL (ref 1.5–4.5)
Glucose: 94 mg/dL (ref 65–99)
Potassium: 4.1 mmol/L (ref 3.5–5.2)
Sodium: 141 mmol/L (ref 134–144)
Total Protein: 6.7 g/dL (ref 6.0–8.5)

## 2020-09-14 LAB — LIPID PANEL
Chol/HDL Ratio: 3.6 ratio (ref 0.0–4.4)
Cholesterol, Total: 189 mg/dL (ref 100–199)
HDL: 52 mg/dL (ref >39)
LDL Chol Calc (NIH): 121 mg/dL — ABNORMAL HIGH (ref 0–99)
Triglycerides: 85 mg/dL (ref 0–149)
VLDL Cholesterol Cal: 16 mg/dL (ref 5–40)

## 2020-09-14 MED ORDER — LISINOPRIL-HYDROCHLOROTHIAZIDE 20-12.5 MG PO TABS: 1.0000 | ORAL_TABLET | Freq: Every day | ORAL | 0 refills | Status: DC

## 2020-09-14 NOTE — Progress Notes (Signed)
Subjective:    Patient ID: Ariana Dillon, female    DOB: 13-Oct-1946, 74 y.o.   MRN: [id number]   Chief Complaint: Medical Management of Chronic Issues    HPI:  1. Essential hypertension, benign No c/o chest pain ,sob or headache. Does not check blood pressure at home.  BP Readings from Last 3 Encounters:  09/14/20 138/75  03/12/20 114/66  07/24/19 130/71     2. Mixed hyperlipidemia Does try to watch diet but does no dedicated exercise. Is on no statin- caused muscle aches. Lab Results  Component Value Date   CHOL 180 03/12/2020   HDL 49 03/12/2020   LDLCALC 115 (H) 03/12/2020   TRIG 85 03/12/2020   CHOLHDL 3.7 03/12/2020   The 10-year ASCVD risk score Ariana Dillon) is: 21.6%   Values used to calculate the score:     Age: 75 years     Sex: Female     Is Non-Hispanic African American: No     Diabetic: No     Tobacco smoker: No     Systolic Blood Pressure: 335 mmHg     Is BP treated: Yes     HDL Cholesterol: 49 mg/dL     Total Cholesterol: 180 mg/dL   3. BMI 28.0-28.9,adult No weight changes Wt Readings from Last 3 Encounters:  09/14/20 171 lb (77.6 kg)  03/12/20 167 lb (75.8 kg)  07/24/19 186 lb (84.4 kg)   BMI Readings from Last 3 Encounters:  09/14/20 28.46 kg/m  03/12/20 27.79 kg/m  07/24/19 30.95 kg/m       Outpatient Encounter Medications as of 09/14/2020  Medication Sig  . lisinopril-hydrochlorothiazide (ZESTORETIC) 20-12.5 MG tablet Take 1 tablet by mouth daily.    History reviewed. No pertinent surgical history.  Family History  Problem Relation Age of Onset  . Diabetes Mother   . Alzheimer's disease Mother   . Cancer Father        lung  . Polymyositis Sister   . Heart disease Brother   . Heart disease Brother   . Heart disease Brother     New complaints: None today  Social history: Lives by herself. Has family that checks on her daily  Controlled substance contract: n/a    Review of Systems    Constitutional: Negative for diaphoresis.  Eyes: Negative for pain.  Respiratory: Negative for shortness of breath.   Cardiovascular: Negative for chest pain, palpitations and leg swelling.  Gastrointestinal: Negative for abdominal pain.  Endocrine: Negative for polydipsia.  Skin: Negative for rash.  Neurological: Negative for dizziness, weakness and headaches.  Hematological: Does not bruise/bleed easily.  All other systems reviewed and are negative.      Objective:   Physical Exam Vitals and nursing note reviewed.  Constitutional:      General: She is not in acute distress.    Appearance: Normal appearance. She is well-developed.  HENT:     Head: Normocephalic.     Nose: Nose normal.  Eyes:     Pupils: Pupils are equal, round, and reactive to light.  Neck:     Vascular: No carotid bruit or JVD.  Cardiovascular:     Rate and Rhythm: Normal rate and regular rhythm.     Heart sounds: Normal heart sounds.  Pulmonary:     Effort: Pulmonary effort is normal. No respiratory distress.     Breath sounds: Normal breath sounds. No wheezing or rales.  Chest:     Chest wall: No tenderness.  Abdominal:     General: Bowel sounds are normal. There is no distension or abdominal bruit.     Palpations: Abdomen is soft. There is no hepatomegaly, splenomegaly, mass or pulsatile mass.     Tenderness: There is no abdominal tenderness.  Musculoskeletal:        General: Normal range of motion.     Cervical back: Normal range of motion and neck supple.  Lymphadenopathy:     Cervical: No cervical adenopathy.  Skin:    General: Skin is warm and dry.  Neurological:     Mental Status: She is alert and oriented to person, place, and time.     Deep Tendon Reflexes: Reflexes are normal and symmetric.  Psychiatric:        Behavior: Behavior normal.        Thought Content: Thought content normal.        Judgment: Judgment normal.    BP 138/75   Pulse (!) 56   Temp (!) 97 F (36.1 C)  (Temporal)   Resp 20   Ht $R'5\' 5"'SH$  (1.651 m)   Wt 171 lb (77.6 kg)   SpO2 96%   BMI 28.46 kg/m   EKG- Ariana Hough, FNP       Assessment & Plan:  Ariana Dillon comes in today with chief complaint of Medical Management of Chronic Issues   Diagnosis and orders addressed:  1. Essential hypertension, benign Low sodium diet - EKG 12-Lead - CMP14+EGFR - CBC with Differential/Platelet - lisinopril-hydrochlorothiazide (ZESTORETIC) 20-12.5 MG tablet; Take 1 tablet by mouth daily.  Dispense: 90 tablet; Refill: 0  2. Mixed hyperlipidemia Low fat diet Discussed possibly going on statin 1 day a week. Wanted to wait and see what labs looked like - Lipid panel  3. BMI 26.0-26.9,adult Discussed diet and exercise for person with BMI >25 Will recheck weight in 3-6 months    Labs pending Health Maintenance reviewed Diet and exercise encouraged  Follow up plan: 6 months   Ariana Hassell Done, FNP

## 2020-09-14 NOTE — Patient Instructions (Signed)

## 2020-09-15 MED ORDER — ROSUVASTATIN CALCIUM 20 MG PO TABS: ORAL_TABLET | ORAL | 3 refills | Status: DC

## 2020-09-15 NOTE — Addendum Note (Signed)
Addended by: Bennie Pierini on: 09/15/2020 03:32 PM   Modules accepted: Orders

## 2020-10-29 IMAGING — DX DG CHEST 2V
2 series · 2 of 2 positions shown · non-contrast
Comparison: 11/30/2017

CLINICAL DATA: Hypertension

EXAM:
CHEST - 2 VIEW

[chest pa]
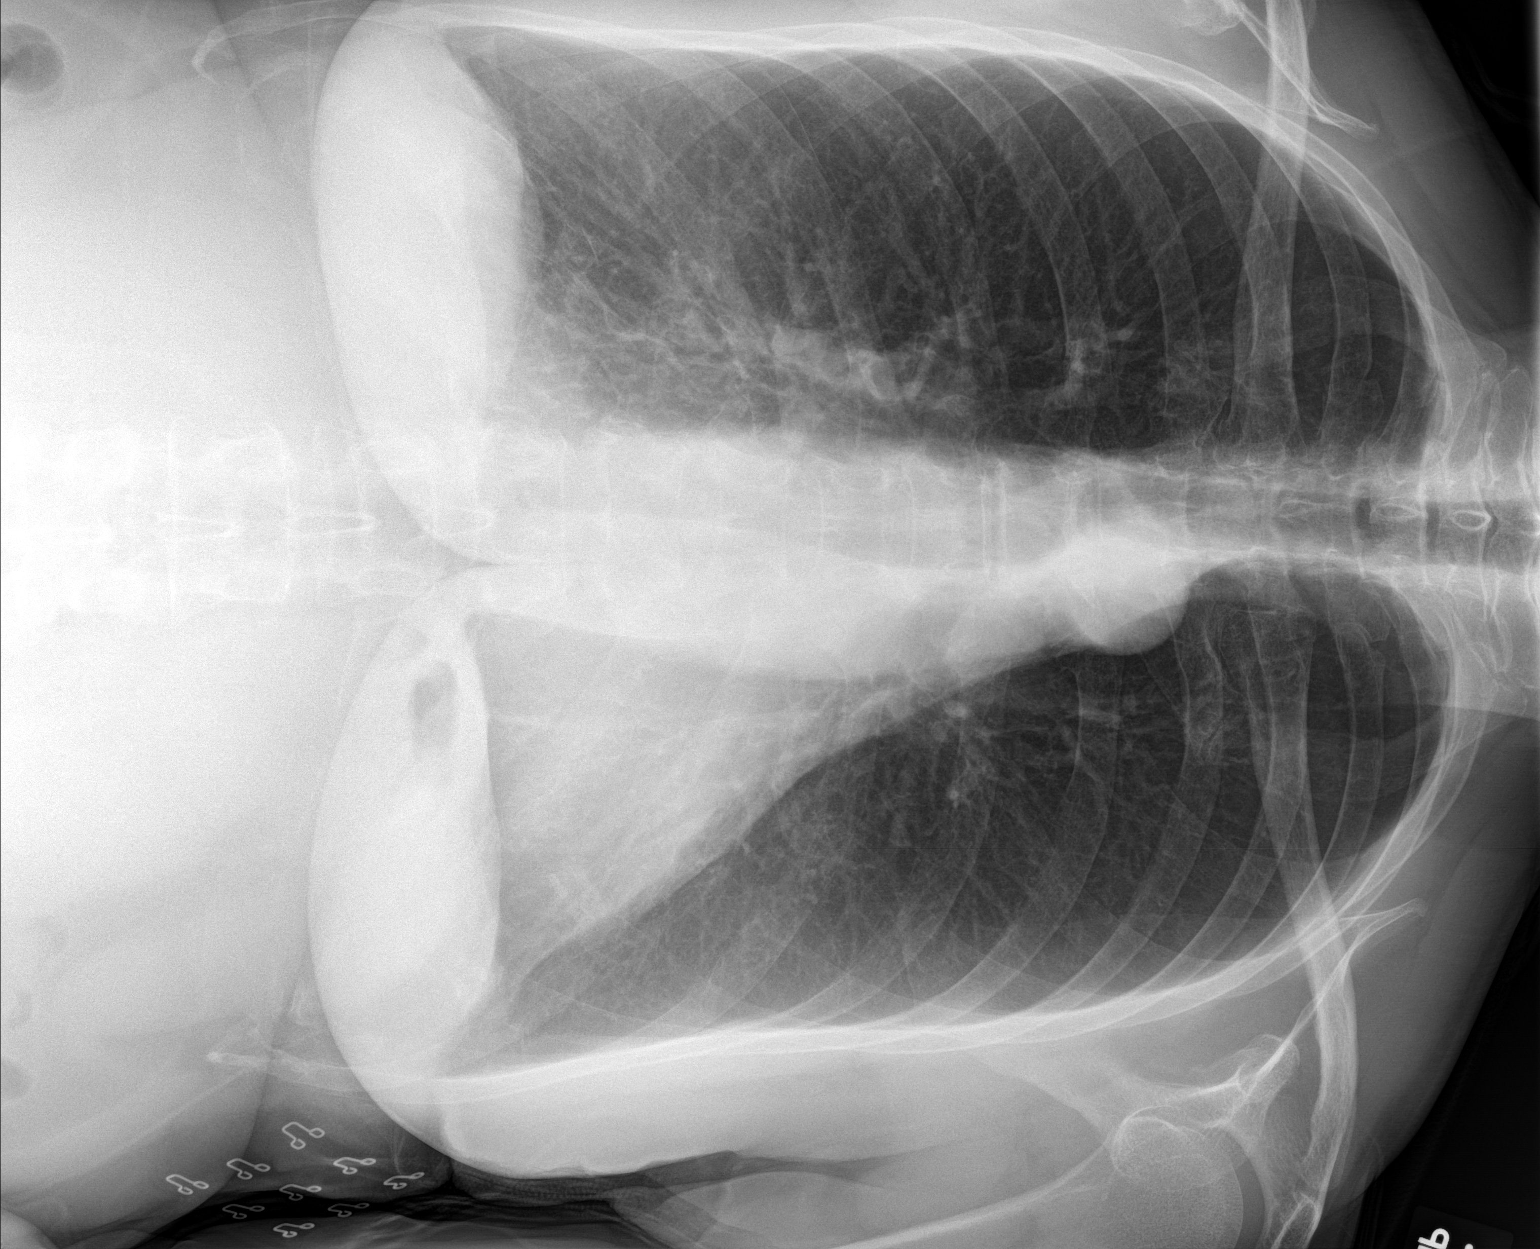

[chest lat]
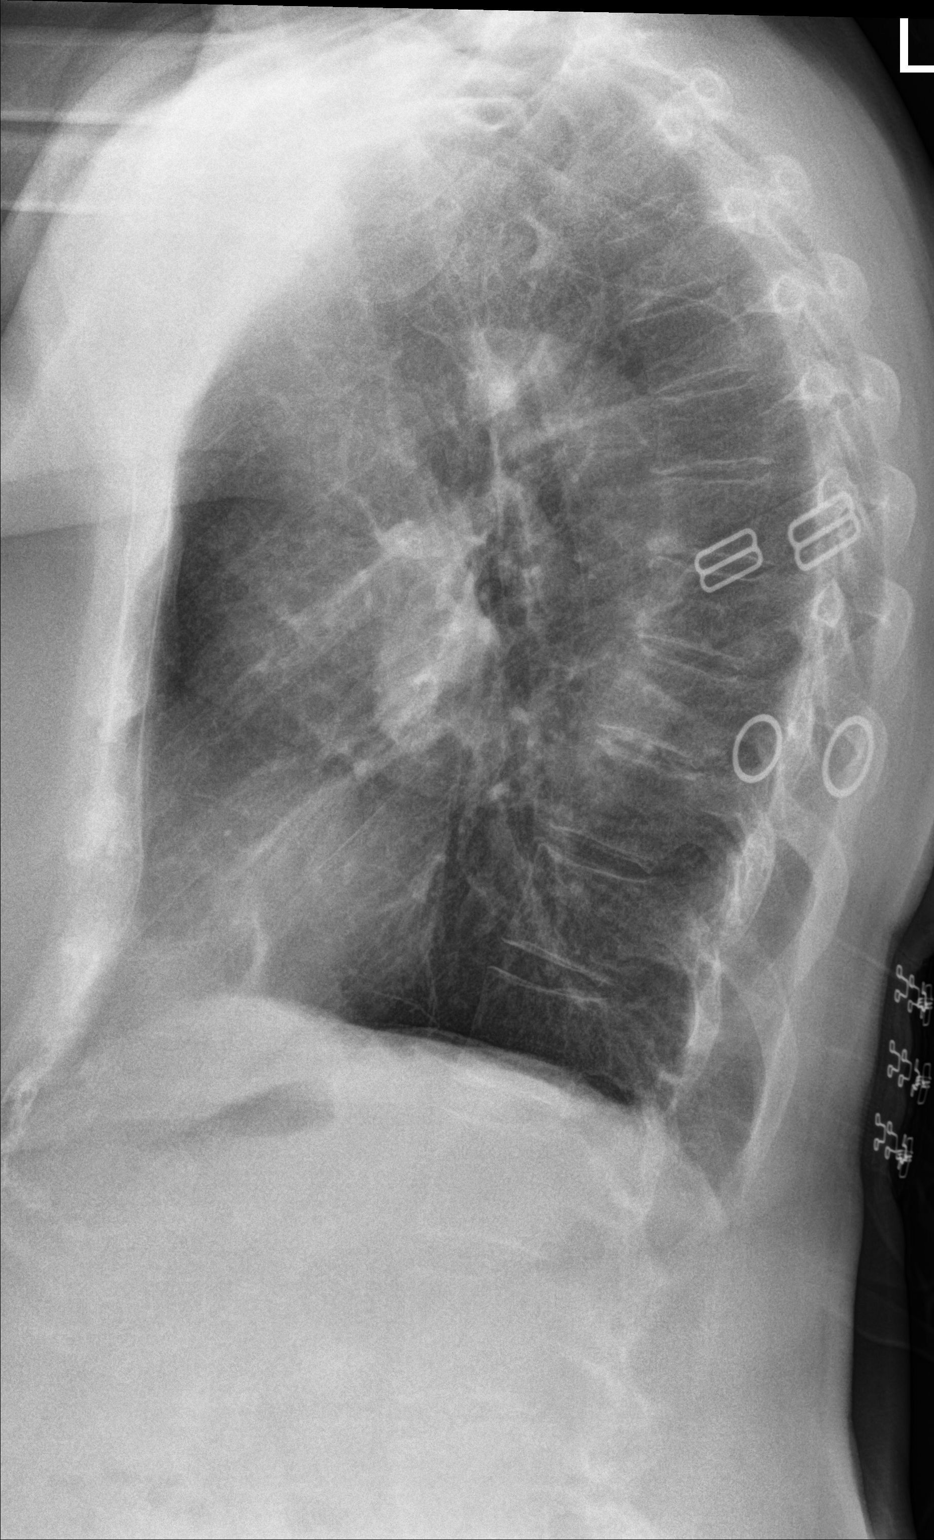

[2 of 2 positions shown; findings below may reference images not displayed]

FINDINGS: The heart size and mediastinal contours are within normal limits.
Both lungs are clear. The visualized skeletal structures are
unremarkable.
IMPRESSION: No active cardiopulmonary disease.

## 2020-10-30 DIAGNOSIS — H5213 Myopia, bilateral: Secondary | ICD-10-CM | POA: Diagnosis not present

## 2020-10-30 DIAGNOSIS — H52229 Regular astigmatism, unspecified eye: Secondary | ICD-10-CM | POA: Diagnosis not present

## 2020-10-30 DIAGNOSIS — H2510 Age-related nuclear cataract, unspecified eye: Secondary | ICD-10-CM | POA: Diagnosis not present

## 2020-10-30 DIAGNOSIS — H524 Presbyopia: Secondary | ICD-10-CM | POA: Diagnosis not present

## 2020-10-30 DIAGNOSIS — H5203 Hypermetropia, bilateral: Secondary | ICD-10-CM | POA: Diagnosis not present

## 2020-12-22 ENCOUNTER — Other Ambulatory Visit: Payer: Self-pay | Admitting: Nurse Practitioner

## 2020-12-22 DIAGNOSIS — I10 Essential (primary) hypertension: Secondary | ICD-10-CM

## 2021-03-11 ENCOUNTER — Ambulatory Visit (INDEPENDENT_AMBULATORY_CARE_PROVIDER_SITE_OTHER): Payer: Medicare HMO | Admitting: Nurse Practitioner

## 2021-03-11 ENCOUNTER — Encounter: Payer: Self-pay | Admitting: Nurse Practitioner

## 2021-03-11 ENCOUNTER — Other Ambulatory Visit: Payer: Self-pay

## 2021-03-11 VITALS — BP 140/78 | HR 63 | Temp 97.5°F | Resp 20 | Ht 65.0 in | Wt 170.0 lb

## 2021-03-11 DIAGNOSIS — Z6829 Body mass index (BMI) 29.0-29.9, adult: Secondary | ICD-10-CM

## 2021-03-11 DIAGNOSIS — I10 Essential (primary) hypertension: Secondary | ICD-10-CM

## 2021-03-11 DIAGNOSIS — E782 Mixed hyperlipidemia: Secondary | ICD-10-CM

## 2021-03-11 MED ORDER — LISINOPRIL-HYDROCHLOROTHIAZIDE 20-12.5 MG PO TABS: 1.0000 | ORAL_TABLET | Freq: Every day | ORAL | 1 refills | Status: DC

## 2021-03-11 NOTE — Patient Instructions (Signed)

## 2021-03-11 NOTE — Progress Notes (Signed)
Subjective:    Patient ID: Ariana Dillon, female    DOB: [birth year], 75 y.o.   MRN: [id number]   Chief Complaint: Medical Management of Chronic Issues    HPI:  1. Essential hypertension, benign No c/o chest pain,sob or headache. Does check bllod pressuar at home occasionally. Systolic is always below 120.  BP Readings from Last 3 Encounters:  03/11/21 140/78  09/14/20 138/75  03/12/20 114/66     2. Mixed hyperlipidemia Does try to watch diet. She stays very active with her yard work and walking her dog. She is not taking cretor because he caused myalgia. Lab Results  Component Value Date   CHOL 189 09/14/2020   HDL 52 09/14/2020   LDLCALC 121 (H) 09/14/2020   TRIG 85 09/14/2020   CHOLHDL 3.6 09/14/2020   The 10-year ASCVD risk score Denman George DC Jr., et al., 2013) is: 22.2% .  3. BMI 29.0-29.9,adult No recent weight changes Wt Readings from Last 3 Encounters:  03/11/21 170 lb (77.1 kg)  09/14/20 171 lb (77.6 kg)  03/12/20 167 lb (75.8 kg)   BMI Readings from Last 3 Encounters:  03/11/21 28.29 kg/m  09/14/20 28.46 kg/m  03/12/20 27.79 kg/m        Outpatient Encounter Medications as of 03/11/2021  Medication Sig  . lisinopril-hydrochlorothiazide (ZESTORETIC) 20-12.5 MG tablet TAKE 1 TABLET BY MOUTH EVERY DAY  . [DISCONTINUED] rosuvastatin (CRESTOR) 20 MG tablet Start out with 1 weekly and increase as tolerated   No facility-administered encounter medications on file as of 03/11/2021.    History reviewed. No pertinent surgical history.  Family History  Problem Relation Age of Onset  . Diabetes Mother   . Alzheimer's disease Mother   . Cancer Father        lung  . Polymyositis Sister   . Heart disease Brother   . Heart disease Brother   . Heart disease Brother     New complaints: None today  Social history: Lives by herself. Has a puppy- beagle  Controlled substance contract: n/a    Review of Systems  Constitutional: Negative for diaphoresis.   Eyes: Negative for pain.  Respiratory: Negative for shortness of breath.   Cardiovascular: Negative for chest pain, palpitations and leg swelling.  Gastrointestinal: Negative for abdominal pain.  Endocrine: Negative for polydipsia.  Skin: Negative for rash.  Neurological: Negative for dizziness, weakness and headaches.  Hematological: Does not bruise/bleed easily.  All other systems reviewed and are negative.      Objective:   Physical Exam Vitals and nursing note reviewed.  Constitutional:      General: She is not in acute distress.    Appearance: Normal appearance. She is well-developed.  HENT:     Head: Normocephalic.     Nose: Nose normal.  Eyes:     Pupils: Pupils are equal, round, and reactive to light.  Neck:     Vascular: No carotid bruit or JVD.  Cardiovascular:     Rate and Rhythm: Normal rate and regular rhythm.     Heart sounds: Normal heart sounds.  Pulmonary:     Effort: Pulmonary effort is normal. No respiratory distress.     Breath sounds: Normal breath sounds. No wheezing or rales.  Chest:     Chest wall: No tenderness.  Abdominal:     General: Bowel sounds are normal. There is no distension or abdominal bruit.     Palpations: Abdomen is soft. There is no hepatomegaly, splenomegaly, mass or pulsatile mass.  Tenderness: There is no abdominal tenderness.  Musculoskeletal:        General: Normal range of motion.     Cervical back: Normal range of motion and neck supple.  Lymphadenopathy:     Cervical: No cervical adenopathy.  Skin:    General: Skin is warm and dry.  Neurological:     Mental Status: She is alert and oriented to person, place, and time.     Deep Tendon Reflexes: Reflexes are normal and symmetric.  Psychiatric:        Behavior: Behavior normal.        Thought Content: Thought content normal.        Judgment: Judgment normal.     BP 140/78   Pulse 63   Temp (!) 97.5 F (36.4 C) (Temporal)   Resp 20   Ht 5\' 5"  (1.651 m)   Wt  170 lb (77.1 kg)   SpO2 98%   BMI 28.29 kg/m        Assessment & Plan:  Ariana Dillon comes in today with chief complaint of Medical Management of Chronic Issues   Diagnosis and orders addressed:  1. Essential hypertension, benign Low sodium diet - lisinopril-hydrochlorothiazide (ZESTORETIC) 20-12.5 MG tablet; Take 1 tablet by mouth daily.  Dispense: 90 tablet; Refill: 1  2. Mixed hyperlipidemia Low fat diet Labs pending- may try repatha if ldl are worse  3. BMI 29.0-29.9,adult Discussed diet and exercise for person with BMI >25 Will recheck weight in 3-6 months     Labs pending Health Maintenance reviewed Diet and exercise encouraged  Follow up plan: 6 months   Ariana Dillon 07-22-1986, FNP

## 2021-03-12 LAB — CBC WITH DIFFERENTIAL/PLATELET
Basophils Absolute: 0.1 10*3/uL (ref 0.0–0.2)
Basos: 1 %
EOS (ABSOLUTE): 0.1 10*3/uL (ref 0.0–0.4)
Eos: 1 %
Hematocrit: 38.1 % (ref 34.0–46.6)
Hemoglobin: 12.9 g/dL (ref 11.1–15.9)
Immature Grans (Abs): 0 10*3/uL (ref 0.0–0.1)
Immature Granulocytes: 0 %
Lymphocytes Absolute: 1.8 10*3/uL (ref 0.7–3.1)
Lymphs: 25 %
MCH: 29.7 pg (ref 26.6–33.0)
MCHC: 33.9 g/dL (ref 31.5–35.7)
MCV: 88 fL (ref 79–97)
Monocytes Absolute: 0.6 10*3/uL (ref 0.1–0.9)
Monocytes: 8 %
Neutrophils Absolute: 4.8 10*3/uL (ref 1.4–7.0)
Neutrophils: 65 %
Platelets: 182 10*3/uL (ref 150–450)
RBC: 4.35 x10E6/uL (ref 3.77–5.28)
RDW: 12.4 % (ref 11.7–15.4)
WBC: 7.4 10*3/uL (ref 3.4–10.8)

## 2021-03-12 LAB — CMP14+EGFR
ALT: 12 IU/L (ref 0–32)
AST: 17 IU/L (ref 0–40)
Albumin/Globulin Ratio: 2 (ref 1.2–2.2)
Albumin: 4.5 g/dL (ref 3.7–4.7)
Alkaline Phosphatase: 98 IU/L (ref 44–121)
BUN/Creatinine Ratio: 23 (ref 12–28)
BUN: 23 mg/dL (ref 8–27)
Bilirubin Total: 0.4 mg/dL (ref 0.0–1.2)
CO2: 23 mmol/L (ref 20–29)
Calcium: 9.9 mg/dL (ref 8.7–10.3)
Chloride: 106 mmol/L (ref 96–106)
Creatinine, Ser: 1.01 mg/dL — ABNORMAL HIGH (ref 0.57–1.00)
Globulin, Total: 2.3 g/dL (ref 1.5–4.5)
Glucose: 100 mg/dL — ABNORMAL HIGH (ref 65–99)
Potassium: 4.4 mmol/L (ref 3.5–5.2)
Sodium: 145 mmol/L — ABNORMAL HIGH (ref 134–144)
Total Protein: 6.8 g/dL (ref 6.0–8.5)
eGFR: 58 mL/min/{1.73_m2} — ABNORMAL LOW (ref >59)

## 2021-03-12 LAB — LIPID PANEL
Chol/HDL Ratio: 3.6 ratio (ref 0.0–4.4)
Cholesterol, Total: 180 mg/dL (ref 100–199)
HDL: 50 mg/dL (ref >39)
LDL Chol Calc (NIH): 113 mg/dL — ABNORMAL HIGH (ref 0–99)
Triglycerides: 93 mg/dL (ref 0–149)
VLDL Cholesterol Cal: 17 mg/dL (ref 5–40)

## 2021-05-03 ENCOUNTER — Ambulatory Visit (INDEPENDENT_AMBULATORY_CARE_PROVIDER_SITE_OTHER): Payer: Medicare HMO

## 2021-05-03 DIAGNOSIS — Z0000 Encounter for general adult medical examination without abnormal findings: Secondary | ICD-10-CM | POA: Diagnosis not present

## 2021-05-03 NOTE — Progress Notes (Signed)
MEDICARE ANNUAL WELLNESS VISIT  05/03/2021  Telephone Visit Disclaimer This Medicare AWV was conducted by telephone due to national recommendations for restrictions regarding the COVID-19 Pandemic (e.g. social distancing).  I verified, using two identifiers, that I am speaking with Ariana Dillon or their authorized healthcare agent. I discussed the limitations, risks, security, and privacy concerns of performing an evaluation and management service by telephone and the potential availability of an in-person appointment in the future. The patient expressed understanding and agreed to proceed.  Location of Patient: Home Location of Provider (nurse):  [facility]  Subjective:    Ariana Dillon is a 75 y.o. female patient of Ariana Pierini, FNP who had a Medicare Annual Wellness Visit today via telephone. Ariana Dillon is Retired and lives alone. She has one daughter and no grandchildren. She reports that she is socially active and does interact with friends/family regularly.  is moderately physically active and enjoys reading, working around her house, and being active in her church.  Patient Care Team: Ariana Pierini, FNP as PCP - General (Nurse Practitioner)  Advanced Directives 05/03/2021 10/27/2014  Does Patient Have a Medical Advance Directive? No No  Would patient like information on creating a medical advance directive? No - Patient declined Yes - Educational materials given    Hospital Utilization Over the Past 12 Months: # of hospitalizations or ER visits: 0 # of surgeries: 0  Review of Systems    Patient reports that her overall health is better compared to last year.  History obtained from chart review and the patient  Patient Reported Readings (BP, Pulse, CBG, Weight, etc) none  Pain Assessment Pain : No/denies pain     Current Medications & Allergies (verified) Allergies as of 05/03/2021       Reactions   Lipitor [atorvastatin] Other (See Comments)   Myalgia    Penicillins         Medication List        Accurate as of May 03, 2021  4:17 PM. If you have any questions, ask your nurse or doctor.          calcium carbonate 1250 (500 Ca) MG tablet Commonly known as: OS-CAL - dosed in mg of elemental calcium Take 1 tablet by mouth.   Fish Oil 1000 MG Caps Take 2,000 mg by mouth.   lisinopril-hydrochlorothiazide 20-12.5 MG tablet Commonly known as: ZESTORETIC Take 1 tablet by mouth daily.   psyllium 58.6 % packet Commonly known as: METAMUCIL Take 1 packet by mouth daily.        History (reviewed): Past Medical History:  Diagnosis Date   Hypertension    History reviewed. No pertinent surgical history. Family History  Problem Relation Age of Onset   Diabetes Mother    Alzheimer's disease Mother    Cancer Father        lung   Polymyositis Sister    Heart disease Brother    Heart disease Brother    Heart disease Brother    Social History   Socioeconomic History   Marital status: Married    Spouse name: Not on file   Number of children: Not on file   Years of education: Not on file   Highest education level: Not on file  Occupational History   Not on file  Tobacco Use   Smoking status: Never   Smokeless tobacco: Never  Substance and Sexual Activity   Alcohol use: No   Drug use: No   Sexual activity: Not on file  Other Topics Concern   Not on file  Social History Narrative   Not on file   Social Determinants of Health   Financial Resource Strain: Not on file  Food Insecurity: Not on file  Transportation Needs: Not on file  Physical Activity: Not on file  Stress: Not on file  Social Connections: Not on file    Activities of Daily Living In your present state of health, do you have any difficulty performing the following activities: 05/03/2021 09/14/2020  Hearing? N N  Vision? N N  Difficulty concentrating or making decisions? N N  Walking or climbing stairs? N N  Dressing or bathing? N N  Doing  errands, shopping? N N  Preparing Food and eating ? N -  Using the Toilet? N -  In the past six months, have you accidently leaked urine? N -  Do you have problems with loss of bowel control? N -  Managing your Medications? N -  Managing your Finances? N -  Housekeeping or managing your Housekeeping? N -  Some recent data might be hidden    Patient Education/ Literacy How often do you need to have someone help you when you read instructions, pamphlets, or other written materials from your doctor or pharmacy?: 1 - Never What is the last grade level you completed in school?: Associates degree  Exercise Current Exercise Habits: Home exercise routine, Type of exercise: strength training/weights;stretching, Time (Minutes): 25, Frequency (Times/Week): 3, Weekly Exercise (Minutes/Week): 75, Intensity: Mild, Exercise limited by: None identified  Diet Patient reports consuming 2 meals a day and 1 snack(s) a day Patient reports that her primary diet is: Regular Patient reports that she does have regular access to food.   Depression Screen PHQ 2/9 Scores 03/11/2021 09/14/2020 03/12/2020 07/24/2019 11/21/2018 11/30/2017 11/24/2016  PHQ - 2 Score 0 0 0 0 0 0 0  PHQ- 9 Score 0 - - - - - -     Fall Risk Fall Risk  05/03/2021 03/11/2021 09/14/2020 03/12/2020 07/24/2019  Falls in the past year? 0 0 0 0 0  Follow up Falls evaluation completed - - - -     Objective:  Ariana Dillon seemed alert and oriented and she participated appropriately during our telephone visit.  Blood Pressure Weight BMI  BP Readings from Last 3 Encounters:  03/11/21 140/78  09/14/20 138/75  03/12/20 114/66   Wt Readings from Last 3 Encounters:  03/11/21 170 lb (77.1 kg)  09/14/20 171 lb (77.6 kg)  03/12/20 167 lb (75.8 kg)   BMI Readings from Last 1 Encounters:  03/11/21 28.29 kg/m    *Unable to obtain current vital signs, weight, and BMI due to telephone visit type  Hearing/Vision  Ariana Dillon did not seem to have difficulty  with hearing/understanding during the telephone conversation Reports that she has had a formal eye exam by an eye care professional within the past year Reports that she has not had a formal hearing evaluation within the past year *Unable to fully assess hearing and vision during telephone visit type  Cognitive Function: 6CIT Screen 05/03/2021  What Year? 0 points  What month? 0 points  What time? 0 points  Count back from 20 0 points  Months in reverse 0 points  Repeat phrase 0 points  Total Score 0   (Normal:0-7, Significant for Dysfunction: >8)  Normal Cognitive Function Screening: Yes   Immunization & Health Maintenance Record Immunization History  Administered Date(s) Administered   Fluad Quad(high Dose 65+) 07/24/2019, 07/22/2020  Influenza, High Dose Seasonal PF 08/10/2018   Influenza,inj,Quad PF,6+ Mos 08/14/2013, 08/12/2014, 08/03/2015, 07/25/2016, 09/07/2017   Influenza-Unspecified 08/28/2017   Moderna SARS-COV2 Booster Vaccination 10/21/2020   Moderna Sars-Covid-2 Vaccination 11/19/2019, 12/17/2019   Pneumococcal Conjugate-13 10/09/2013   Pneumococcal Polysaccharide-23 05/01/2015   Tdap 10/09/2013   Zoster, Live 09/26/2014    Health Maintenance  Topic Date Due   Zoster Vaccines- Shingrix (1 of 2) Never done   COVID-19 Vaccine (4 - Booster for Moderna series) 02/19/2021   INFLUENZA VACCINE  05/24/2021   MAMMOGRAM  07/22/2022   TETANUS/TDAP  10/10/2023   COLONOSCOPY (Pts 45-14yrs Insurance coverage will need to be confirmed)  08/22/2026   DEXA SCAN  Completed   Hepatitis C Screening  Completed   PNA vac Low Risk Adult  Completed   HPV VACCINES  Aged Out       Assessment  This is a routine wellness examination for [employer].  Health Maintenance: Due or Overdue Health Maintenance Due  Topic Date Due   Zoster Vaccines- Shingrix (1 of 2) Never done   COVID-19 Vaccine (4 - Booster for Moderna series) 02/19/2021    Ariana Dillon does not need a referral  for Community Assistance: Care Management:   no Social Work:    no Prescription Assistance:  no Nutrition/Diabetes Education:  no   Plan:  Personalized Goals  Goals Addressed             This Visit's Progress    Patient Stated       05/03/2021 AWV Goal: Fall Prevention  Over the next year, patient will decrease their risk for falls by: Using assistive devices, such as a cane or walker, as needed Identifying fall risks within their home and correcting them by: Removing throw rugs Adding handrails to stairs or ramps Removing clutter and keeping a clear pathway throughout the home Increasing light, especially at night Adding shower handles/bars Raising toilet seat Identifying potential personal risk factors for falls: Medication side effects Incontinence/urgency Vestibular dysfunction Hearing loss Musculoskeletal disorders Neurological disorders Orthostatic hypotension          Personalized Health Maintenance & Screening Recommendations  Up to date  Lung Cancer Screening Recommended: no (Low Dose CT Chest recommended if Age 58-80 years, 30 pack-year currently smoking OR have quit w/in past 15 years) Hepatitis C Screening recommended: no HIV Screening recommended: no  Advanced Directives: Written information was not prepared per patient's request.  Referrals & Orders No orders of the defined types were placed in this encounter.   Follow-up Plan Follow-up with Ariana Pierini, FNP as planned    I have personally reviewed and noted the following in the patient's chart:   Medical and social history Use of alcohol, tobacco or illicit drugs  Current medications and supplements Functional ability and status Nutritional status Physical activity Advanced directives List of other physicians Hospitalizations, surgeries, and ER visits in previous 12 months Vitals Screenings to include cognitive, depression, and falls Referrals and appointments  In  addition, I have reviewed and discussed with Ariana Dillon certain preventive protocols, quality metrics, and best practice recommendations. A written personalized care plan for preventive services as well as general preventive health recommendations is available and can be mailed to the patient at her request.      Mariam Dollar, LPN   6/60/6301

## 2021-06-29 ENCOUNTER — Other Ambulatory Visit: Payer: Self-pay | Admitting: Nurse Practitioner

## 2021-06-29 DIAGNOSIS — Z1231 Encounter for screening mammogram for malignant neoplasm of breast: Secondary | ICD-10-CM

## 2021-08-18 ENCOUNTER — Other Ambulatory Visit: Payer: Self-pay

## 2021-08-18 ENCOUNTER — Ambulatory Visit
Admission: RE | Admit: 2021-08-18 | Discharge: 2021-08-18 | Disposition: A | Discharge status: Home / Self Care | Payer: Medicare HMO | Source: Ambulatory Visit | Attending: Nurse Practitioner | Admitting: Nurse Practitioner

## 2021-08-18 DIAGNOSIS — Z1231 Encounter for screening mammogram for malignant neoplasm of breast: Secondary | ICD-10-CM

## 2021-09-14 ENCOUNTER — Ambulatory Visit (INDEPENDENT_AMBULATORY_CARE_PROVIDER_SITE_OTHER): Payer: Medicare HMO | Admitting: Nurse Practitioner

## 2021-09-14 ENCOUNTER — Encounter: Payer: Self-pay | Admitting: Nurse Practitioner

## 2021-09-14 ENCOUNTER — Other Ambulatory Visit: Payer: Self-pay

## 2021-09-14 VITALS — BP 119/65 | HR 65 | Temp 98.1°F | Resp 20 | Ht 65.0 in | Wt 173.0 lb

## 2021-09-14 DIAGNOSIS — I10 Essential (primary) hypertension: Secondary | ICD-10-CM

## 2021-09-14 DIAGNOSIS — Z6829 Body mass index (BMI) 29.0-29.9, adult: Secondary | ICD-10-CM | POA: Diagnosis not present

## 2021-09-14 DIAGNOSIS — Z23 Encounter for immunization: Secondary | ICD-10-CM

## 2021-09-14 DIAGNOSIS — E782 Mixed hyperlipidemia: Secondary | ICD-10-CM

## 2021-09-14 LAB — CBC WITH DIFFERENTIAL/PLATELET
Basophils Absolute: 0.1 10*3/uL (ref 0.0–0.2)
Basos: 1 %
EOS (ABSOLUTE): 0.2 10*3/uL (ref 0.0–0.4)
Eos: 2 %
Hematocrit: 36.9 % (ref 34.0–46.6)
Hemoglobin: 12.6 g/dL (ref 11.1–15.9)
Immature Grans (Abs): 0 10*3/uL (ref 0.0–0.1)
Immature Granulocytes: 1 %
Lymphocytes Absolute: 1.5 10*3/uL (ref 0.7–3.1)
Lymphs: 21 %
MCH: 30 pg (ref 26.6–33.0)
MCHC: 34.1 g/dL (ref 31.5–35.7)
MCV: 88 fL (ref 79–97)
Monocytes Absolute: 0.5 10*3/uL (ref 0.1–0.9)
Monocytes: 7 %
Neutrophils Absolute: 4.8 10*3/uL (ref 1.4–7.0)
Neutrophils: 68 %
Platelets: 219 10*3/uL (ref 150–450)
RBC: 4.2 x10E6/uL (ref 3.77–5.28)
RDW: 11.5 % — ABNORMAL LOW (ref 11.7–15.4)
WBC: 7.2 10*3/uL (ref 3.4–10.8)

## 2021-09-14 LAB — CMP14+EGFR
ALT: 12 IU/L (ref 0–32)
AST: 16 IU/L (ref 0–40)
Albumin/Globulin Ratio: 1.6 (ref 1.2–2.2)
Albumin: 4.4 g/dL (ref 3.7–4.7)
Alkaline Phosphatase: 94 IU/L (ref 44–121)
BUN/Creatinine Ratio: 22 (ref 12–28)
BUN: 22 mg/dL (ref 8–27)
Bilirubin Total: 0.3 mg/dL (ref 0.0–1.2)
CO2: 26 mmol/L (ref 20–29)
Calcium: 10.2 mg/dL (ref 8.7–10.3)
Chloride: 106 mmol/L (ref 96–106)
Creatinine, Ser: 1.02 mg/dL — ABNORMAL HIGH (ref 0.57–1.00)
Globulin, Total: 2.7 g/dL (ref 1.5–4.5)
Glucose: 104 mg/dL — ABNORMAL HIGH (ref 70–99)
Potassium: 4.4 mmol/L (ref 3.5–5.2)
Sodium: 144 mmol/L (ref 134–144)
Total Protein: 7.1 g/dL (ref 6.0–8.5)
eGFR: 57 mL/min/{1.73_m2} — ABNORMAL LOW (ref >59)

## 2021-09-14 LAB — LIPID PANEL
Chol/HDL Ratio: 4.5 ratio — ABNORMAL HIGH (ref 0.0–4.4)
Cholesterol, Total: 179 mg/dL (ref 100–199)
HDL: 40 mg/dL (ref >39)
LDL Chol Calc (NIH): 121 mg/dL — ABNORMAL HIGH (ref 0–99)
Triglycerides: 100 mg/dL (ref 0–149)
VLDL Cholesterol Cal: 18 mg/dL (ref 5–40)

## 2021-09-14 MED ORDER — LISINOPRIL-HYDROCHLOROTHIAZIDE 20-12.5 MG PO TABS: 1.0000 | ORAL_TABLET | Freq: Every day | ORAL | 1 refills | Status: DC

## 2021-09-14 MED ORDER — FISH OIL 1000 MG PO CAPS: 2000.0000 mg | ORAL_CAPSULE | Freq: Every day | ORAL | 1 refills | Status: DC

## 2021-09-14 NOTE — Progress Notes (Signed)
Subjective:    Patient ID: Ariana Dillon, female    DOB: [birth year], 75 y.o.   MRN: [id number]   Chief Complaint: Medical Management of Chronic Issues (6 mo )    HPI:  1. Need for immunization against influenza Flu shot given  2. Essential hypertension, benign No c/o chest pain, sob or headache. Does not check her blood pressure at home. BP Readings from Last 3 Encounters:  09/14/21 119/65  03/11/21 140/78  09/14/20 138/75     3. Mixed hyperlipidemia She does not watch diet very closely. She is involved in an exercise class. Lab Results  Component Value Date   CHOL 180 03/11/2021   HDL 50 03/11/2021   LDLCALC 113 (H) 03/11/2021   TRIG 93 03/11/2021   CHOLHDL 3.6 03/11/2021     4. BMI 29.0-29.9,adult Weight is up 3 lbs. Wt Readings from Last 3 Encounters:  09/14/21 173 lb (78.5 kg)  03/11/21 170 lb (77.1 kg)  09/14/20 171 lb (77.6 kg)   BMI Readings from Last 3 Encounters:  09/14/21 28.79 kg/m  03/11/21 28.29 kg/m  09/14/20 28.46 kg/m       Outpatient Encounter Medications as of 09/14/2021  Medication Sig   calcium carbonate (OS-CAL - DOSED IN MG OF ELEMENTAL CALCIUM) 1250 (500 Ca) MG tablet Take 1 tablet by mouth.   lisinopril-hydrochlorothiazide (ZESTORETIC) 20-12.5 MG tablet Take 1 tablet by mouth daily.   Omega-3 Fatty Acids (FISH OIL) 1000 MG CAPS Take 2,000 mg by mouth.   psyllium (METAMUCIL) 58.6 % packet Take 1 packet by mouth daily.   No facility-administered encounter medications on file as of 09/14/2021.    Past Surgical History:  Procedure Laterality Date   BREAST CYST ASPIRATION Right     Family History  Problem Relation Age of Onset   Diabetes Mother    Alzheimer's disease Mother    Cancer Father        lung   Polymyositis Sister    Breast cancer Maternal Aunt    Heart disease Brother    Heart disease Brother    Heart disease Brother     New complaints: None today  Social history: Lives by herself with her  dog  Controlled substance contract: n/a     Review of Systems  Constitutional:  Negative for diaphoresis.  Eyes:  Negative for pain.  Respiratory:  Negative for shortness of breath.   Cardiovascular:  Negative for chest pain, palpitations and leg swelling.  Gastrointestinal:  Negative for abdominal pain.  Endocrine: Negative for polydipsia.  Skin:  Negative for rash.  Neurological:  Negative for dizziness, weakness and headaches.  Hematological:  Does not bruise/bleed easily.  All other systems reviewed and are negative.     Objective:   Physical Exam Vitals and nursing note reviewed.  Constitutional:      General: She is not in acute distress.    Appearance: Normal appearance. She is well-developed.  HENT:     Head: Normocephalic.     Right Ear: Tympanic membrane normal.     Left Ear: Tympanic membrane normal.     Nose: Nose normal.     Mouth/Throat:     Mouth: Mucous membranes are moist.  Eyes:     Pupils: Pupils are equal, round, and reactive to light.  Neck:     Vascular: No carotid bruit or JVD.  Cardiovascular:     Rate and Rhythm: Normal rate and regular rhythm.     Heart sounds: Normal heart sounds.  Pulmonary:  Effort: Pulmonary effort is normal. No respiratory distress.     Breath sounds: Normal breath sounds. No wheezing or rales.  Chest:     Chest wall: No tenderness.  Abdominal:     General: Bowel sounds are normal. There is no distension or abdominal bruit.     Palpations: Abdomen is soft. There is no hepatomegaly, splenomegaly, mass or pulsatile mass.     Tenderness: There is no abdominal tenderness.  Musculoskeletal:        General: Normal range of motion.     Cervical back: Normal range of motion and neck supple.  Lymphadenopathy:     Cervical: No cervical adenopathy.  Skin:    General: Skin is warm and dry.  Neurological:     Mental Status: She is alert and oriented to person, place, and time.     Deep Tendon Reflexes: Reflexes are normal  and symmetric.  Psychiatric:        Behavior: Behavior normal.        Thought Content: Thought content normal.        Judgment: Judgment normal.    BP 119/65   Pulse 65   Temp 98.1 F (36.7 C)   Resp 20   Ht $R'5\' 5"'iI$  (1.651 m)   Wt 173 lb (78.5 kg)   SpO2 98%   BMI 28.79 kg/m        Assessment & Plan:  Ariana Dillon comes in today with chief complaint of Medical Management of Chronic Issues (6 mo )   Diagnosis and orders addressed:  1. Essential hypertension, benign Low sodium diet - lisinopril-hydrochlorothiazide (ZESTORETIC) 20-12.5 MG tablet; Take 1 tablet by mouth daily.  Dispense: 90 tablet; Refill: 1 - CBC with Differential/Platelet - CMP14+EGFR  2. Mixed hyperlipidemia Low fat diet - Omega-3 Fatty Acids (FISH OIL) 1000 MG CAPS; Take 2 capsules (2,000 mg total) by mouth daily.  Dispense: 180 capsule; Refill: 1 - Lipid panel  3. Need for immunization against influenza - Flu Vaccine QUAD High Dose(Fluad)  4. BMI 29.0-29.9,adult Discussed diet and exercise for person with BMI >25 Will recheck weight in 3-6 months    Labs pending Health Maintenance reviewed Diet and exercise encouraged  Follow up plan: 6 months   Mary-Margaret Hassell Done, FNP

## 2021-09-14 NOTE — Patient Instructions (Signed)

## 2022-01-31 DIAGNOSIS — R531 Weakness: Secondary | ICD-10-CM | POA: Diagnosis not present

## 2022-01-31 DIAGNOSIS — N179 Acute kidney failure, unspecified: Secondary | ICD-10-CM | POA: Diagnosis not present

## 2022-01-31 DIAGNOSIS — K219 Gastro-esophageal reflux disease without esophagitis: Secondary | ICD-10-CM | POA: Diagnosis not present

## 2022-01-31 DIAGNOSIS — R42 Dizziness and giddiness: Secondary | ICD-10-CM | POA: Diagnosis not present

## 2022-01-31 DIAGNOSIS — I10 Essential (primary) hypertension: Secondary | ICD-10-CM | POA: Diagnosis not present

## 2022-01-31 DIAGNOSIS — R55 Syncope and collapse: Secondary | ICD-10-CM | POA: Diagnosis not present

## 2022-01-31 DIAGNOSIS — R0789 Other chest pain: Secondary | ICD-10-CM | POA: Diagnosis not present

## 2022-02-24 ENCOUNTER — Ambulatory Visit (INDEPENDENT_AMBULATORY_CARE_PROVIDER_SITE_OTHER): Payer: Medicare HMO | Admitting: Nurse Practitioner

## 2022-02-24 ENCOUNTER — Encounter: Payer: Self-pay | Admitting: Nurse Practitioner

## 2022-02-24 VITALS — BP 127/82 | HR 69 | Temp 97.6°F | Resp 20 | Ht 65.0 in | Wt 177.0 lb

## 2022-02-24 DIAGNOSIS — R42 Dizziness and giddiness: Secondary | ICD-10-CM | POA: Diagnosis not present

## 2022-02-24 DIAGNOSIS — Z09 Encounter for follow-up examination after completed treatment for conditions other than malignant neoplasm: Secondary | ICD-10-CM | POA: Diagnosis not present

## 2022-02-24 NOTE — Patient Instructions (Signed)

## 2022-02-24 NOTE — Progress Notes (Signed)
? ?Subjective:  ? ? Patient ID: Ariana Dillon, female    DOB: 1946/10/12, 76 y.o.   MRN: [id number] ? ? ?Chief Complaint: hospital follow up ? ?HPI ? ?Patient went to the ED on 01/31/22 with dizziness. Dx with vertigo. Told her to drink lots of liquids and not skip meals. They did no scans and gave her no meds. All labs were normal. They told her they thought it was from dehydration. She has had no episodes since then but does not feel "up to par". ? ? ?Review of Systems  ?Constitutional:  Negative for diaphoresis.  ?Eyes:  Negative for pain.  ?Respiratory:  Negative for shortness of breath.   ?Cardiovascular:  Negative for chest pain, palpitations and leg swelling.  ?Gastrointestinal:  Negative for abdominal pain.  ?Endocrine: Negative for polydipsia.  ?Skin:  Negative for rash.  ?Neurological:  Positive for light-headedness (none since going to hospital). Negative for dizziness, weakness and headaches.  ?Hematological:  Does not bruise/bleed easily.  ?All other systems reviewed and are negative. ? ?   ?Objective:  ? Physical Exam ?Vitals and nursing note reviewed.  ?Constitutional:   ?   General: She is not in acute distress. ?   Appearance: Normal appearance. She is well-developed.  ?Neck:  ?   Vascular: No carotid bruit or JVD.  ?Cardiovascular:  ?   Rate and Rhythm: Normal rate and regular rhythm.  ?   Heart sounds: Normal heart sounds.  ?Pulmonary:  ?   Effort: Pulmonary effort is normal. No respiratory distress.  ?   Breath sounds: Normal breath sounds. No wheezing or rales.  ?Chest:  ?   Chest wall: No tenderness.  ?Abdominal:  ?   General: Bowel sounds are normal. There is no distension or abdominal bruit.  ?   Palpations: Abdomen is soft. There is no hepatomegaly, splenomegaly, mass or pulsatile mass.  ?   Tenderness: There is no abdominal tenderness.  ?Musculoskeletal:     ?   General: Normal range of motion.  ?   Cervical back: Normal range of motion and neck supple.  ?Lymphadenopathy:  ?   Cervical: No  cervical adenopathy.  ?Skin: ?   General: Skin is warm and dry.  ?Neurological:  ?   Mental Status: She is alert and oriented to person, place, and time.  ?   Deep Tendon Reflexes: Reflexes are normal and symmetric.  ?Psychiatric:     ?   Behavior: Behavior normal.     ?   Thought Content: Thought content normal.     ?   Judgment: Judgment normal.  ? ? ?BP 127/82 Comment: home  Pulse 69   Temp 97.6 ?F (36.4 ?C) (Skin)   Resp 20   Ht 5\' 5"  (1.651 m)   Wt 177 lb (80.3 kg)   BMI 29.45 kg/m?  ? ? ? ? ?   ?Assessment & Plan:  ? ?Ariana Dillon in today with chief complaint of Hospitalization Follow-up (Still feels jittery and not feel right) ? ? ?1. Vertigo ?Force fluids ?Fall precautions ? ?2. Hospital discharge follow-up ?Hospital report reviewed- labs not available to review ? ? ? ?The above assessment and management plan was discussed with the patient. The patient verbalized understanding of and has agreed to the management plan. Patient is aware to call the clinic if symptoms persist or worsen. Patient is aware when to return to the clinic for a follow-up visit. Patient educated on when it is appropriate to go to the emergency department.  ? ?  Mary-Margaret Hassell Done, FNP ? ? ? ?

## 2022-03-09 ENCOUNTER — Other Ambulatory Visit: Payer: Self-pay | Admitting: Nurse Practitioner

## 2022-03-09 DIAGNOSIS — I10 Essential (primary) hypertension: Secondary | ICD-10-CM

## 2022-03-14 ENCOUNTER — Encounter: Payer: Self-pay | Admitting: Nurse Practitioner

## 2022-03-14 ENCOUNTER — Ambulatory Visit (INDEPENDENT_AMBULATORY_CARE_PROVIDER_SITE_OTHER): Payer: Medicare HMO | Admitting: Nurse Practitioner

## 2022-03-14 VITALS — BP 125/72 | HR 71 | Temp 97.4°F | Resp 20 | Ht 65.0 in | Wt 177.0 lb

## 2022-03-14 DIAGNOSIS — I10 Essential (primary) hypertension: Secondary | ICD-10-CM | POA: Diagnosis not present

## 2022-03-14 DIAGNOSIS — E782 Mixed hyperlipidemia: Secondary | ICD-10-CM

## 2022-03-14 DIAGNOSIS — Z6829 Body mass index (BMI) 29.0-29.9, adult: Secondary | ICD-10-CM

## 2022-03-14 NOTE — Progress Notes (Signed)
Subjective:    Patient ID: Ariana Dillon, female    DOB: [birth year], 76 y.o.   MRN: [id number]   Chief Complaint: medical management of chronic issues     HPI:  Ariana Dillon is a 76 y.o. who identifies as a female who was assigned female at birth.   Social history: Lives with: lives by herself with her dog. Family check son her frequently. Work history: retired   Scientist, forensic in today for follow up of the following chronic medical issues:  1. Essential hypertension, benign No c/o chest pain, sob or headache. Doe snot check blood pressure at home. BP Readings from Last 3 Encounters:  02/24/22 127/82  09/14/21 119/65  03/11/21 140/78     2. Mixed hyperlipidemia Does watch diet but does no dedicated exercise.' Lab Results  Component Value Date   CHOL 179 09/14/2021   HDL 40 09/14/2021   LDLCALC 121 (H) 09/14/2021   TRIG 100 09/14/2021   CHOLHDL 4.5 (H) 09/14/2021   The 10-year ASCVD risk score (Arnett DK, et al., 2019) is: 19.9%   3. BMI 29.0-29.9,adult No recent weight changes Wt Readings from Last 3 Encounters:  03/14/22 177 lb (80.3 kg)  02/24/22 177 lb (80.3 kg)  09/14/21 173 lb (78.5 kg)   BMI Readings from Last 3 Encounters:  03/14/22 29.45 kg/m  02/24/22 29.45 kg/m  09/14/21 28.79 kg/m      New complaints: None today  Allergies  Allergen Reactions   Lipitor [Atorvastatin] Other (See Comments)    Myalgia    Penicillins    Sulfa Antibiotics     Itching    Outpatient Encounter Medications as of 03/14/2022  Medication Sig   calcium carbonate (OS-CAL - DOSED IN MG OF ELEMENTAL CALCIUM) 1250 (500 Ca) MG tablet Take 1 tablet by mouth.   lisinopril-hydrochlorothiazide (ZESTORETIC) 20-12.5 MG tablet TAKE 1 TABLET BY MOUTH EVERY DAY   Omega-3 Fatty Acids (FISH OIL) 1000 MG CAPS Take 2 capsules (2,000 mg total) by mouth daily.   psyllium (METAMUCIL) 58.6 % packet Take 1 packet by mouth daily.   No facility-administered encounter medications on file as of  03/14/2022.    Past Surgical History:  Procedure Laterality Date   BREAST CYST ASPIRATION Right     Family History  Problem Relation Age of Onset   Diabetes Mother    Alzheimer's disease Mother    Cancer Father        lung   Polymyositis Sister    Breast cancer Maternal Aunt    Heart disease Brother    Heart disease Brother    Heart disease Brother       Controlled substance contract: n/a     Review of Systems  Constitutional:  Negative for diaphoresis.  Eyes:  Negative for pain.  Respiratory:  Negative for shortness of breath.   Cardiovascular:  Negative for chest pain, palpitations and leg swelling.  Gastrointestinal:  Negative for abdominal pain.  Endocrine: Negative for polydipsia.  Skin:  Negative for rash.  Neurological:  Negative for dizziness, weakness and headaches.  Hematological:  Does not bruise/bleed easily.  All other systems reviewed and are negative.     Objective:   Physical Exam Vitals and nursing note reviewed.  Constitutional:      General: She is not in acute distress.    Appearance: Normal appearance. She is well-developed.  HENT:     Head: Normocephalic.     Right Ear: Tympanic membrane normal.     Left Ear: Tympanic membrane  normal.     Nose: Nose normal.     Mouth/Throat:     Mouth: Mucous membranes are moist.  Eyes:     Pupils: Pupils are equal, round, and reactive to light.  Neck:     Vascular: No carotid bruit or JVD.  Cardiovascular:     Rate and Rhythm: Normal rate and regular rhythm.     Heart sounds: Normal heart sounds.  Pulmonary:     Effort: Pulmonary effort is normal. No respiratory distress.     Breath sounds: Normal breath sounds. No wheezing or rales.  Chest:     Chest wall: No tenderness.  Abdominal:     General: Bowel sounds are normal. There is no distension or abdominal bruit.     Palpations: Abdomen is soft. There is no hepatomegaly, splenomegaly, mass or pulsatile mass.     Tenderness: There is no  abdominal tenderness.  Musculoskeletal:        General: Normal range of motion.     Cervical back: Normal range of motion and neck supple.     Right lower leg: Edema (1+) present.     Left lower leg: Edema (1+) present.  Lymphadenopathy:     Cervical: No cervical adenopathy.  Skin:    General: Skin is warm and dry.  Neurological:     Mental Status: She is alert and oriented to person, place, and time.     Deep Tendon Reflexes: Reflexes are normal and symmetric.  Psychiatric:        Behavior: Behavior normal.        Thought Content: Thought content normal.        Judgment: Judgment normal.   BP 125/72   Pulse 71   Temp (!) 97.4 F (36.3 C) (Temporal)   Resp 20   Ht _0  (1.651 m)   Wt 177 lb (80.3 kg)   SpO2 95%   BMI 29.45 kg/m         Assessment & Plan:  Ariana Dillon comes in today with chief complaint of Medical Management of Chronic Issues   Diagnosis and orders addressed:  1. Essential hypertension, benign Low sodium diet - CBC with Differential/Platelet - CMP14+EGFR  2. Mixed hyperlipidemia Low fat diet - Lipid panel  3. BMI 29.0-29.9,adult Discussed diet and exercise for person with BMI >25 Will recheck weight in 3-6 months    Labs pending Health Maintenance reviewed Diet and exercise encouraged  Follow up plan: 6 months   Mary-Margaret Hassell Done, FNP

## 2022-03-14 NOTE — Patient Instructions (Signed)

## 2022-03-15 LAB — LIPID PANEL
Chol/HDL Ratio: 4.2 ratio (ref 0.0–4.4)
Cholesterol, Total: 173 mg/dL (ref 100–199)
HDL: 41 mg/dL (ref >39)
LDL Chol Calc (NIH): 113 mg/dL — ABNORMAL HIGH (ref 0–99)
Triglycerides: 106 mg/dL (ref 0–149)
VLDL Cholesterol Cal: 19 mg/dL (ref 5–40)

## 2022-03-15 LAB — CMP14+EGFR
ALT: 15 IU/L (ref 0–32)
AST: 18 IU/L (ref 0–40)
Albumin/Globulin Ratio: 1.5 (ref 1.2–2.2)
Albumin: 4.1 g/dL (ref 3.7–4.7)
Alkaline Phosphatase: 100 IU/L (ref 44–121)
BUN/Creatinine Ratio: 22 (ref 12–28)
BUN: 20 mg/dL (ref 8–27)
Bilirubin Total: <0.2 mg/dL (ref 0.0–1.2)
CO2: 23 mmol/L (ref 20–29)
Calcium: 9.8 mg/dL (ref 8.7–10.3)
Chloride: 106 mmol/L (ref 96–106)
Creatinine, Ser: 0.89 mg/dL (ref 0.57–1.00)
Globulin, Total: 2.8 g/dL (ref 1.5–4.5)
Glucose: 81 mg/dL (ref 70–99)
Potassium: 4.4 mmol/L (ref 3.5–5.2)
Sodium: 142 mmol/L (ref 134–144)
Total Protein: 6.9 g/dL (ref 6.0–8.5)
eGFR: 68 mL/min/{1.73_m2} (ref >59)

## 2022-03-15 LAB — CBC WITH DIFFERENTIAL/PLATELET
Basophils Absolute: 0 10*3/uL (ref 0.0–0.2)
Basos: 1 %
EOS (ABSOLUTE): 0.1 10*3/uL (ref 0.0–0.4)
Eos: 3 %
Hematocrit: 37.6 % (ref 34.0–46.6)
Hemoglobin: 12.4 g/dL (ref 11.1–15.9)
Immature Grans (Abs): 0 10*3/uL (ref 0.0–0.1)
Immature Granulocytes: 0 %
Lymphocytes Absolute: 1.4 10*3/uL (ref 0.7–3.1)
Lymphs: 26 %
MCH: 29.2 pg (ref 26.6–33.0)
MCHC: 33 g/dL (ref 31.5–35.7)
MCV: 89 fL (ref 79–97)
Monocytes Absolute: 0.6 10*3/uL (ref 0.1–0.9)
Monocytes: 12 %
Neutrophils Absolute: 3.1 10*3/uL (ref 1.4–7.0)
Neutrophils: 58 %
Platelets: 173 10*3/uL (ref 150–450)
RBC: 4.24 x10E6/uL (ref 3.77–5.28)
RDW: 12.8 % (ref 11.7–15.4)
WBC: 5.2 10*3/uL (ref 3.4–10.8)

## 2022-03-28 DIAGNOSIS — E7800 Pure hypercholesterolemia, unspecified: Secondary | ICD-10-CM | POA: Diagnosis not present

## 2022-03-28 DIAGNOSIS — H25813 Combined forms of age-related cataract, bilateral: Secondary | ICD-10-CM | POA: Diagnosis not present

## 2022-03-28 DIAGNOSIS — H5200 Hypermetropia, unspecified eye: Secondary | ICD-10-CM | POA: Diagnosis not present

## 2022-03-29 DIAGNOSIS — Z0100 Encounter for examination of eyes and vision without abnormal findings: Secondary | ICD-10-CM | POA: Diagnosis not present

## 2022-05-31 ENCOUNTER — Ambulatory Visit (INDEPENDENT_AMBULATORY_CARE_PROVIDER_SITE_OTHER): Payer: Medicare HMO

## 2022-05-31 VITALS — Wt 177.0 lb

## 2022-05-31 DIAGNOSIS — Z0000 Encounter for general adult medical examination without abnormal findings: Secondary | ICD-10-CM

## 2022-05-31 NOTE — Progress Notes (Signed)
Subjective:   Ariana Dillon is a 76 y.o. female who presents for Medicare Annual (Subsequent) preventive examination.  Virtual Visit via Telephone Note  I connected with  Ariana Dillon on 05/31/22 at 12:00 PM EDT by telephone and verified that I am speaking with the correct person using two identifiers.  Location: Patient: Home Provider: [facility] Persons participating in the virtual visit: patient/Nurse Health Advisor   I discussed the limitations, risks, security and privacy concerns of performing an evaluation and management service by telephone and the availability of in person appointments. The patient expressed understanding and agreed to proceed.  Interactive audio and video telecommunications were attempted between this nurse and patient, however failed, due to patient having technical difficulties OR patient did not have access to video capability.  We continued and completed visit with audio only.  Some vital signs may be absent or patient reported.   Ariana Dillon Ariana Starling Jessie, LPN   Review of Systems     Cardiac Risk Factors include: advanced age (>64men, >31 women);dyslipidemia;hypertension     Objective:    Today's Vitals   05/31/22 1333  Weight: 177 lb (80.3 kg)   Body mass index is 29.45 kg/m.     05/31/2022    1:38 PM 05/03/2021    4:12 PM 10/27/2014   11:55 AM  Advanced Directives  Does Patient Have a Medical Advance Directive? No No No  Would patient like information on creating a medical advance directive? No - Patient declined No - Patient declined Yes - Educational materials given    Current Medications (verified) Outpatient Encounter Medications as of 05/31/2022  Medication Sig   lisinopril-hydrochlorothiazide (ZESTORETIC) 20-12.5 MG tablet TAKE 1 TABLET BY MOUTH EVERY DAY   Omega-3 Fatty Acids (FISH OIL) 1000 MG CAPS Take 2 capsules (2,000 mg total) by mouth daily.   psyllium (METAMUCIL) 58.6 % packet Take 1 packet by mouth daily. (Patient not taking: Reported on  05/31/2022)   [DISCONTINUED] calcium carbonate (OS-CAL - DOSED IN MG OF ELEMENTAL CALCIUM) 1250 (500 Ca) MG tablet Take 1 tablet by mouth. (Patient not taking: Reported on 03/14/2022)   No facility-administered encounter medications on file as of 05/31/2022.    Allergies (verified) Lipitor [atorvastatin], Penicillins, and Sulfa antibiotics   History: Past Medical History:  Diagnosis Date   Hypertension    Past Surgical History:  Procedure Laterality Date   BREAST CYST ASPIRATION Right    Family History  Problem Relation Age of Onset   Diabetes Mother    Alzheimer's disease Mother    Cancer Father        lung   Polymyositis Sister    Breast cancer Maternal Aunt    Heart disease Brother    Heart disease Brother    Heart disease Brother    Social History   Socioeconomic History   Marital status: Widowed    Spouse name: Not on file   Number of children: 1   Years of education: Not on file   Highest education level: Not on file  Occupational History   Occupation: retired  Tobacco Use   Smoking status: Never   Smokeless tobacco: Never  Substance and Sexual Activity   Alcohol use: No   Drug use: No   Sexual activity: Not on file  Other Topics Concern   Not on file  Social History Narrative   Lives alone - husband passed from Covid in 2021   Daughter lives close by   Social Determinants of Health   Financial Resource Strain:  Low Risk  (05/31/2022)   Overall Financial Resource Strain (CARDIA)    Difficulty of Paying Living Expenses: Not hard at all  Food Insecurity: No Food Insecurity (05/31/2022)   Hunger Vital Sign    Worried About Running Out of Food in the Last Year: Never true    Ran Out of Food in the Last Year: Never true  Transportation Needs: No Transportation Needs (05/31/2022)   PRAPARE - Administrator, Civil Service (Medical): No    Lack of Transportation (Non-Medical): No  Physical Activity: Sufficiently Active (05/31/2022)   Exercise Vital Sign     Days of Exercise per Week: 3 days    Minutes of Exercise per Session: 60 min  Stress: No Stress Concern Present (05/31/2022)   [employer] of Occupational Health - Occupational Stress Questionnaire    Feeling of Stress : Not at all  Social Connections: Moderately Integrated (05/31/2022)   Social Connection and Isolation Panel [NHANES]    Frequency of Communication with Friends and Family: More than three times a week    Frequency of Social Gatherings with Friends and Family: More than three times a week    Attends Religious Services: More than 4 times per year    Active Member of [employer] or Organizations: Yes    Attends Banker Meetings: More than 4 times per year    Marital Status: Widowed    Tobacco Counseling Counseling given: Not Answered   Clinical Intake:  Pre-visit preparation completed: Yes  Pain : No/denies pain     BMI - recorded: 29.45 Nutritional Status: BMI 25 -29 Overweight Nutritional Risks: None Diabetes: No  How often do you need to have someone help you when you read instructions, pamphlets, or other written materials from your doctor or pharmacy?: 1 - Never  Diabetic? no  Interpreter Needed?: No  Information entered by :: Ariana Balthazar, LPN   Activities of Daily Living    05/31/2022    1:38 PM  In your present state of health, do you have any difficulty performing the following activities:  Hearing? 0  Vision? 0  Difficulty concentrating or making decisions? 0  Walking or climbing stairs? 0  Dressing or bathing? 0  Doing errands, shopping? 0  Preparing Food and eating ? N  Using the Toilet? N  In the past six months, have you accidently leaked urine? N  Do you have problems with loss of bowel control? N  Managing your Medications? N  Managing your Finances? N  Housekeeping or managing your Housekeeping? N    Patient Care Team: Bennie Pierini, FNP as PCP - General (Nurse Practitioner)  Indicate any recent Medical  Services you may have received from other than [facility] providers in the past year (date may be approximate).     Assessment:   This is a routine wellness examination for Ariana Dillon.  Hearing/Vision screen Hearing Screening - Comments:: Denies hearing difficulties   Vision Screening - Comments:: Wears rx glasses - up to date with routine eye exams with [facility]  Dietary issues and exercise activities discussed: Current Exercise Habits: Home exercise routine, Type of exercise: treadmill;stretching;strength training/weights;walking, Time (Minutes): 60, Frequency (Times/Week): 3, Weekly Exercise (Minutes/Week): 180, Intensity: Mild, Exercise limited by: None identified   Goals Addressed             This Visit's Progress    Patient Stated       05/31/2022 - Hopes to find a part time job - has [facility] talking with  [facility] about this       Depression Screen    05/31/2022    1:37 PM 03/14/2022    8:15 AM 09/14/2021    9:37 AM 05/03/2021    4:16 PM 03/11/2021    9:24 AM 09/14/2020   10:26 AM 03/12/2020    9:15 AM  PHQ 2/9 Scores  PHQ - 2 Score 0 0 0 0 0 0 0  PHQ- 9 Score  1 2  0      Fall Risk    05/31/2022    1:34 PM 03/14/2022    8:15 AM 09/14/2021    9:36 AM 05/03/2021    4:16 PM 03/11/2021    9:24 AM  Fall Risk   Falls in the past year? 0 0 0 0 0  Number falls in past yr: 0      Injury with Fall? 0      Risk for fall due to : No Fall Risks      Follow up Falls prevention discussed   Falls evaluation completed     FALL RISK PREVENTION PERTAINING TO THE HOME:  Any stairs in or around the home? Yes  If so, are there any without handrails? No  Home free of loose throw rugs in walkways, pet beds, electrical cords, etc? Yes  Adequate lighting in your home to reduce risk of falls? Yes   ASSISTIVE DEVICES UTILIZED TO PREVENT FALLS:  Life alert? No  Use of a cane, walker or w/c? No  Grab bars in the bathroom? No  Shower chair or bench in shower? Yes   Elevated toilet seat or a handicapped toilet? Yes   TIMED UP AND GO:  Was the test performed? No . Telephonic visit  Cognitive Function:        05/31/2022    1:49 PM 05/03/2021    4:14 PM  6CIT Screen  What Year? 0 points 0 points  What month? 0 points 0 points  What time? 0 points 0 points  Count back from 20 0 points 0 points  Months in reverse 0 points 0 points  Repeat phrase 2 points 0 points  Total Score 2 points 0 points    Immunizations Immunization History  Administered Date(s) Administered   Fluad Quad(high Dose 65+) 07/24/2019, 07/22/2020, 09/14/2021   Influenza, High Dose Seasonal PF 08/10/2018   Influenza,inj,Quad PF,6+ Mos 08/14/2013, 08/12/2014, 08/03/2015, 07/25/2016, 09/07/2017   Influenza-Unspecified 08/28/2017   Moderna SARS-COV2 Booster Vaccination 10/21/2020   Moderna Sars-Covid-2 Vaccination 11/19/2019, 12/17/2019   Pneumococcal Conjugate-13 10/09/2013   Pneumococcal Polysaccharide-23 05/01/2015   Tdap 10/09/2013   Zoster, Live 09/26/2014    TDAP status: Up to date  Flu Vaccine status: Up to date  Pneumococcal vaccine status: Up to date  Covid-19 vaccine status: Completed vaccines  Qualifies for Shingles Vaccine? Yes   Zostavax completed Yes   Shingrix Completed?: No.    Education has been provided regarding the importance of this vaccine. Patient has been advised to call insurance company to determine out of pocket expense if they have not yet received this vaccine. Advised may also receive vaccine at local pharmacy or Health Dept. Verbalized acceptance and understanding.  Screening Tests Health Maintenance  Topic Date Due   DEXA SCAN  04/30/2017   COVID-19 Vaccine (3 - Moderna series) 12/16/2020   COLONOSCOPY (Pts 45-64yrs Insurance coverage will need to be confirmed)  08/22/2021   INFLUENZA VACCINE  05/24/2022   Zoster Vaccines- Shingrix (1 of 2) 06/14/2022 (Originally  07/[birth year]/1996)   MAMMOGRAM  08/18/2022   TETANUS/TDAP  10/10/2023    Pneumonia Vaccine 71+ Years old  Completed   Hepatitis C Screening  Completed   HPV VACCINES  Aged Out    Health Maintenance  Health Maintenance Due  Topic Date Due   DEXA SCAN  04/30/2017   COVID-19 Vaccine (3 - Moderna series) 12/16/2020   COLONOSCOPY (Pts 45-72yrs Insurance coverage will need to be confirmed)  08/22/2021   INFLUENZA VACCINE  05/24/2022    Colorectal cancer screening: Type of screening: Colonoscopy. Completed 08/22/2016. Repeat every 3-5 years - patient declines at this time  Mammogram status: Completed 08/18/2021. Repeat every year  Bone Density status: Completed 05/01/2015. Results reflect: Bone density results: OSTEOPENIA. Repeat every 2 years. Patient declines at this time  Lung Cancer Screening: (Low Dose CT Chest recommended if Age 43-80 years, 30 pack-year currently smoking OR have quit w/in 15years.) does not qualify  Additional Screening:  Hepatitis C Screening: does qualify; Completed 02/18/2016  Vision Screening: Recommended annual ophthalmology exams for early detection of glaucoma and other disorders of the eye. Is the patient up to date with their annual eye exam?  Yes  Who is the provider or what is the name of the office in which the patient attends annual eye exams? [facility] If pt is not established with a provider, would they like to be referred to a provider to establish care? No .   Dental Screening: Recommended annual dental exams for proper oral hygiene  Community Resource Referral / Chronic Care Management: CRR required this visit?  No   CCM required this visit?  No      Plan:     I have personally reviewed and noted the following in the patient's chart:   Medical and social history Use of alcohol, tobacco or illicit drugs  Current medications and supplements including opioid prescriptions.  Functional ability and status Nutritional status Physical activity Advanced directives List of other  physicians Hospitalizations, surgeries, and ER visits in previous 12 months Vitals Screenings to include cognitive, depression, and falls Referrals and appointments  In addition, I have reviewed and discussed with patient certain preventive protocols, quality metrics, and best practice recommendations. A written personalized care plan for preventive services as well as general preventive health recommendations were provided to patient.     Ariana Constable, LPN   02/26/3874   Nurse Notes: None

## 2022-05-31 NOTE — Patient Instructions (Signed)
Ms. Ariana Dillon , Thank you for taking time to come for your Medicare Wellness Visit. I appreciate your ongoing commitment to your health goals. Please review the following plan we discussed and let me know if I can assist you in the future.   Screening recommendations/referrals: Colonoscopy: Done 08/22/2016 - **recommended repeat in 3-5 years - let us know if you want [place] to help schedule this Mammogram: Done 08/18/2021 - Repeat annually  Bone Density: Done 05/01/2015 - this is recommended every 2 years - we can do this at our office if you'd like Recommended yearly ophthalmology/optometry visit for glaucoma screening and checkup Recommended yearly dental visit for hygiene and checkup  Vaccinations: Influenza vaccine: Done 09/14/2021 - Repeat annually  Pneumococcal vaccine: Done 10/09/2013 & 05/01/2015 Tdap vaccine: Done 10/09/2013 - Repeat in 10 years  Shingles vaccine: Zostavax done 2015 - due for Shingrix which is 2 doses 2-6 months apart and over 90% effective     Covid-19: Done 11/19/2019, 12/17/2019, 10/21/2020 - for additional boosters, contact pharmacy  Advanced directives: Advance directive discussed with you today. Even though you declined this today, please call our office should you change your mind, and we can give you the proper paperwork for you to fill out.   Conditions/risks identified: Aim for 30 minutes of exercise or brisk walking, 6-8 glasses of water, and 5 servings of fruits and vegetables each day.   Next appointment: Follow up in one year for your annual wellness visit    Preventive Care 65 Years and Older, Female Preventive care refers to lifestyle choices and visits with your health care provider that can promote health and wellness. What does preventive care include? A yearly physical exam. This is also called an annual well check. Dental exams once or twice a year. Routine eye exams. Ask your health care provider how often you should have your eyes checked. Personal  lifestyle choices, including: Daily care of your teeth and gums. Regular physical activity. Eating a healthy diet. Avoiding tobacco and drug use. Limiting alcohol use. Practicing safe sex. Taking low-dose aspirin every day. Taking vitamin and mineral supplements as recommended by your health care provider. What happens during an annual well check? The services and screenings done by your health care provider during your annual well check will depend on your age, overall health, lifestyle risk factors, and family history of disease. Counseling  Your health care provider may ask you questions about your: Alcohol use. Tobacco use. Drug use. Emotional well-being. Home and relationship well-being. Sexual activity. Eating habits. History of falls. Memory and ability to understand (cognition). Work and work Astronomer. Reproductive health. Screening  You may have the following tests or measurements: Height, weight, and BMI. Blood pressure. Lipid and cholesterol levels. These may be checked every 5 years, or more frequently if you are over 36 years old. Skin check. Lung cancer screening. You may have this screening every year starting at age 45 if you have a 30-pack-year history of smoking and currently smoke or have quit within the past 15 years. Fecal occult blood test (FOBT) of the stool. You may have this test every year starting at age 60. Flexible sigmoidoscopy or colonoscopy. You may have a sigmoidoscopy every 5 years or a colonoscopy every 10 years starting at age 70. Hepatitis C blood test. Hepatitis B blood test. Sexually transmitted disease (STD) testing. Diabetes screening. This is done by checking your blood sugar (glucose) after you have not eaten for a while (fasting). You may have this done every  1-3 years. Bone density scan. This is done to screen for osteoporosis. You may have this done starting at age 81. Mammogram. This may be done every 1-2 years. Talk to your  health care provider about how often you should have regular mammograms. Talk with your health care provider about your test results, treatment options, and if necessary, the need for more tests. Vaccines  Your health care provider may recommend certain vaccines, such as: Influenza vaccine. This is recommended every year. Tetanus, diphtheria, and acellular pertussis (Tdap, Td) vaccine. You may need a Td booster every 10 years. Zoster vaccine. You may need this after age 40. Pneumococcal 13-valent conjugate (PCV13) vaccine. One dose is recommended after age 42. Pneumococcal polysaccharide (PPSV23) vaccine. One dose is recommended after age 67. Talk to your health care provider about which screenings and vaccines you need and how often you need them. This information is not intended to replace advice given to you by your health care provider. Make sure you discuss any questions you have with your health care provider. Document Released: 11/06/2015 Document Revised: 06/29/2016 Document Reviewed: 08/11/2015 Elsevier Interactive Patient Education  2017 [employer] Prevention in the Home Falls can cause injuries. They can happen to people of all ages. There are many things you can do to make your home safe and to help prevent falls. What can I do on the outside of my home? Regularly fix the edges of walkways and driveways and fix any cracks. Remove anything that might make you trip as you walk through a door, such as a raised step or threshold. Trim any bushes or trees on the path to your home. Use bright outdoor lighting. Clear any walking paths of anything that might make someone trip, such as rocks or tools. Regularly check to see if handrails are loose or broken. Make sure that both sides of any steps have handrails. Any raised decks and porches should have guardrails on the edges. Have any leaves, snow, or ice cleared regularly. Use sand or salt on walking paths during winter. Clean  up any spills in your garage right away. This includes oil or grease spills. What can I do in the bathroom? Use night lights. Install grab bars by the toilet and in the tub and shower. Do not use towel bars as grab bars. Use non-skid mats or decals in the tub or shower. If you need to sit down in the shower, use a plastic, non-slip stool. Keep the floor dry. Clean up any water that spills on the floor as soon as it happens. Remove soap buildup in the tub or shower regularly. Attach bath mats securely with double-sided non-slip rug tape. Do not have throw rugs and other things on the floor that can make you trip. What can I do in the bedroom? Use night lights. Make sure that you have a light by your bed that is easy to reach. Do not use any sheets or blankets that are too big for your bed. They should not hang down onto the floor. Have a firm chair that has side arms. You can use this for support while you get dressed. Do not have throw rugs and other things on the floor that can make you trip. What can I do in the kitchen? Clean up any spills right away. Avoid walking on wet floors. Keep items that you use a lot in easy-to-reach places. If you need to reach something above you, use a strong step stool that has a  grab bar. Keep electrical cords out of the way. Do not use floor polish or wax that makes floors slippery. If you must use wax, use non-skid floor wax. Do not have throw rugs and other things on the floor that can make you trip. What can I do with my stairs? Do not leave any items on the stairs. Make sure that there are handrails on both sides of the stairs and use them. Fix handrails that are broken or loose. Make sure that handrails are as long as the stairways. Check any carpeting to make sure that it is firmly attached to the stairs. Fix any carpet that is loose or worn. Avoid having throw rugs at the top or bottom of the stairs. If you do have throw rugs, attach them to the  floor with carpet tape. Make sure that you have a light switch at the top of the stairs and the bottom of the stairs. If you do not have them, ask someone to add them for you. What else can I do to help prevent falls? Wear shoes that: Do not have high heels. Have rubber bottoms. Are comfortable and fit you well. Are closed at the toe. Do not wear sandals. If you use a stepladder: Make sure that it is fully opened. Do not climb a closed stepladder. Make sure that both sides of the stepladder are locked into place. Ask someone to hold it for you, if possible. Clearly mark and make sure that you can see: Any grab bars or handrails. First and last steps. Where the edge of each step is. Use tools that help you move around (mobility aids) if they are needed. These include: Canes. Walkers. Scooters. Crutches. Turn on the lights when you go into a dark area. Replace any light bulbs as soon as they burn out. Set up your furniture so you have a clear path. Avoid moving your furniture around. If any of your floors are uneven, fix them. If there are any pets around you, be aware of where they are. Review your medicines with your doctor. Some medicines can make you feel dizzy. This can increase your chance of falling. Ask your doctor what other things that you can do to help prevent falls. This information is not intended to replace advice given to you by your health care provider. Make sure you discuss any questions you have with your health care provider. Document Released: 08/06/2009 Document Revised: 03/17/2016 Document Reviewed: 11/14/2014 Elsevier Interactive Patient Education  2017 [employer].

## 2022-06-03 ENCOUNTER — Other Ambulatory Visit: Payer: Self-pay | Admitting: Nurse Practitioner

## 2022-06-03 DIAGNOSIS — I10 Essential (primary) hypertension: Secondary | ICD-10-CM

## 2022-07-19 DIAGNOSIS — K5792 Diverticulitis of intestine, part unspecified, without perforation or abscess without bleeding: Secondary | ICD-10-CM | POA: Diagnosis not present

## 2022-07-19 DIAGNOSIS — Z6829 Body mass index (BMI) 29.0-29.9, adult: Secondary | ICD-10-CM | POA: Diagnosis not present

## 2022-08-25 ENCOUNTER — Other Ambulatory Visit: Payer: Self-pay | Admitting: Nurse Practitioner

## 2022-08-25 DIAGNOSIS — Z1231 Encounter for screening mammogram for malignant neoplasm of breast: Secondary | ICD-10-CM

## 2022-09-01 ENCOUNTER — Other Ambulatory Visit: Payer: Self-pay | Admitting: Nurse Practitioner

## 2022-09-01 DIAGNOSIS — I10 Essential (primary) hypertension: Secondary | ICD-10-CM

## 2022-09-19 ENCOUNTER — Encounter: Payer: Self-pay | Admitting: Nurse Practitioner

## 2022-09-19 ENCOUNTER — Ambulatory Visit (INDEPENDENT_AMBULATORY_CARE_PROVIDER_SITE_OTHER): Payer: Medicare HMO | Admitting: Nurse Practitioner

## 2022-09-19 ENCOUNTER — Ambulatory Visit
Admission: RE | Admit: 2022-09-19 | Discharge: 2022-09-19 | Disposition: A | Discharge status: Home / Self Care | Payer: Medicare HMO | Source: Ambulatory Visit | Attending: Nurse Practitioner | Admitting: Nurse Practitioner

## 2022-09-19 ENCOUNTER — Ambulatory Visit: Payer: Medicare HMO | Admitting: Nurse Practitioner

## 2022-09-19 VITALS — BP 157/78 | HR 67 | Temp 97.2°F | Resp 20 | Ht 65.0 in | Wt 174.0 lb

## 2022-09-19 DIAGNOSIS — K921 Melena: Secondary | ICD-10-CM | POA: Diagnosis not present

## 2022-09-19 DIAGNOSIS — E782 Mixed hyperlipidemia: Secondary | ICD-10-CM

## 2022-09-19 DIAGNOSIS — Z1231 Encounter for screening mammogram for malignant neoplasm of breast: Secondary | ICD-10-CM

## 2022-09-19 DIAGNOSIS — Z6829 Body mass index (BMI) 29.0-29.9, adult: Secondary | ICD-10-CM | POA: Diagnosis not present

## 2022-09-19 DIAGNOSIS — I10 Essential (primary) hypertension: Secondary | ICD-10-CM

## 2022-09-19 MED ORDER — LISINOPRIL-HYDROCHLOROTHIAZIDE 20-12.5 MG PO TABS: 1.0000 | ORAL_TABLET | Freq: Every day | ORAL | 1 refills | Status: DC

## 2022-09-19 MED ORDER — FISH OIL 1000 MG PO CAPS: 2000.0000 mg | ORAL_CAPSULE | Freq: Every day | ORAL | 1 refills | Status: DC

## 2022-09-19 NOTE — Patient Instructions (Signed)

## 2022-09-19 NOTE — Progress Notes (Signed)
Subjective:    Patient ID: Ariana Dillon, female    DOB: 19-Aug-1946, 76 y.o.   MRN: [id number]   Chief Complaint: medical management of chronic issues   HPI:  Ariana Dillon is a 76 y.o. who identifies as a female who was assigned female at birth.   Social history: Lives with: lives alone with her dog; family checks in on her frequently Work history: retired   Scientist, forensic in today for follow up of the following chronic medical issues:  1. ESSENTIAL HYPERTENSION, BENIGN No c/o chest pain, sob, or headaches. Does check BP at home. Usually runs SBP 100-110s  BP Readings from Last 3 Encounters:  09/19/22 (!) 157/78  03/14/22 125/72  02/24/22 127/82     2. MIXED HYPERLIPIDEMIA Does try to watch her diet; exercises for 1 hour 5 times a week at different [employer]. Lab Results  Component Value Date   CHOL 173 03/14/2022   HDL 41 03/14/2022   LDLCALC 113 (H) 03/14/2022   TRIG 106 03/14/2022   CHOLHDL 4.2 03/14/2022   The 10-year ASCVD risk score (Arnett DK, et al., 2019) is: 28.3%   3. BMI 29.0-29.9,adult Weight is down 3 lbs since last check. Wt Readings from Last 3 Encounters:  09/19/22 174 lb (78.9 kg)  05/31/22 177 lb (80.3 kg)  03/14/22 177 lb (80.3 kg)   BMI Readings from Last 3 Encounters:  09/19/22 28.96 kg/m  05/31/22 29.45 kg/m  03/14/22 29.45 kg/m      New complaints: None today.  Allergies  Allergen Reactions   Lipitor [Atorvastatin] Other (See Comments)    Myalgia    Penicillins    Sulfa Antibiotics     Itching    Outpatient Encounter Medications as of 09/19/2022  Medication Sig   lisinopril-hydrochlorothiazide (ZESTORETIC) 20-12.5 MG tablet TAKE 1 TABLET BY MOUTH EVERY DAY   Omega-3 Fatty Acids (FISH OIL) 1000 MG CAPS Take 2 capsules (2,000 mg total) by mouth daily.   psyllium (METAMUCIL) 58.6 % packet Take 1 packet by mouth daily. (Patient not taking: Reported on 05/31/2022)   No facility-administered encounter medications on file as of  09/19/2022.    Past Surgical History:  Procedure Laterality Date   BREAST CYST ASPIRATION Right     Family History  Problem Relation Age of Onset   Diabetes Mother    Alzheimer's disease Mother    Cancer Father        lung   Polymyositis Sister    Breast cancer Maternal Aunt    Heart disease Brother    Heart disease Brother    Heart disease Brother       Controlled substance contract: n/a     Review of Systems  Constitutional:  Negative for appetite change, chills and fever.  Eyes:  Negative for pain.  Respiratory:  Negative for chest tightness and shortness of breath.   Cardiovascular:  Positive for leg swelling. Negative for chest pain and palpitations.       Occasional leg swelling at the end of the day, resolves with rest.  Gastrointestinal:  Positive for blood in stool and diarrhea. Negative for abdominal pain.       Had diverticulitis in September and since then she has occasional blood in her stool if she eats something that upsets her stomach; has diarrhea occasionally as well.  Endocrine: Negative for polydipsia and polyphagia.  Genitourinary:  Negative for dysuria.  Musculoskeletal:  Negative for arthralgias.  Skin:  Negative for rash.  Neurological:  Negative for dizziness,  weakness and headaches.  Hematological:  Negative for adenopathy. Does not bruise/bleed easily.  All other systems reviewed and are negative.      Objective:   Physical Exam Vitals and nursing note reviewed.  Constitutional:      General: She is not in acute distress.    Appearance: Normal appearance. She is well-developed. She is not toxic-appearing.  HENT:     Head: Normocephalic and atraumatic.     Right Ear: Tympanic membrane normal.     Left Ear: Tympanic membrane normal.     Nose: Nose normal.     Mouth/Throat:     Mouth: Mucous membranes are moist.     Pharynx: Oropharynx is clear.  Eyes:     Conjunctiva/sclera: Conjunctivae normal.     Pupils: Pupils are equal,  round, and reactive to light.  Neck:     Vascular: No carotid bruit or JVD.  Cardiovascular:     Rate and Rhythm: Normal rate and regular rhythm.     Pulses: Normal pulses.     Heart sounds: Normal heart sounds. No murmur heard. Pulmonary:     Effort: Pulmonary effort is normal. No respiratory distress.     Breath sounds: Normal breath sounds. No wheezing, rhonchi or rales.  Chest:     Chest wall: No tenderness.  Abdominal:     General: Bowel sounds are normal.     Palpations: Abdomen is soft. There is no hepatomegaly, splenomegaly or mass.     Tenderness: There is no abdominal tenderness. There is no guarding or rebound.  Musculoskeletal:        General: Normal range of motion.     Cervical back: Normal range of motion. No tenderness.     Right lower leg: No edema.     Left lower leg: No edema.  Lymphadenopathy:     Cervical: No cervical adenopathy.  Skin:    General: Skin is warm and dry.     Capillary Refill: Capillary refill takes less than 2 seconds.     Findings: No rash.  Neurological:     General: No focal deficit present.     Mental Status: She is alert and oriented to person, place, and time.  Psychiatric:        Mood and Affect: Mood normal.        Behavior: Behavior normal.      BP (!) 157/78   Pulse 67   Temp (!) 97.2 F (36.2 C) (Temporal)   Resp 20   Ht _0  (1.651 m)   Wt 174 lb (78.9 kg)   SpO2 95%   BMI 28.96 kg/m       Assessment & Plan:   Ariana Dillon comes in today with chief complaint of Medical Management of Chronic Issues   Diagnosis and orders addressed:  1. ESSENTIAL HYPERTENSION, BENIGN Low sodium diet - lisinopril-hydrochlorothiazide (ZESTORETIC) 20-12.5 MG tablet; Take 1 tablet by mouth daily.  Dispense: 90 tablet; Refill: 1 - CBC with Differential/Platelet - CMP14+EGFR  2. MIXED HYPERLIPIDEMIA Low fat diet - Omega-3 Fatty Acids (FISH OIL) 1000 MG CAPS; Take 2 capsules (2,000 mg total) by mouth daily.  Dispense: 180  capsule; Refill: 1 - Lipid panel  3. BMI 29.0-29.9,adult Discussed diet and exercise for person with BMI > 25. Will recheck weight in 3-6 months.  4. Bloody stools - Ambulatory referral to Gastroenterology   Labs pending Health Maintenance reviewed Diet and exercise encouraged  Follow up plan: 6 months  Collene Leyden, FNP student  Mary-Margaret Hassell Done, FNP

## 2022-09-20 LAB — CBC WITH DIFFERENTIAL/PLATELET
Basophils Absolute: 0.1 10*3/uL (ref 0.0–0.2)
Basos: 1 %
EOS (ABSOLUTE): 0.1 10*3/uL (ref 0.0–0.4)
Eos: 1 %
Hematocrit: 38.1 % (ref 34.0–46.6)
Hemoglobin: 13.1 g/dL (ref 11.1–15.9)
Immature Grans (Abs): 0 10*3/uL (ref 0.0–0.1)
Immature Granulocytes: 1 %
Lymphocytes Absolute: 2 10*3/uL (ref 0.7–3.1)
Lymphs: 24 %
MCH: 30.9 pg (ref 26.6–33.0)
MCHC: 34.4 g/dL (ref 31.5–35.7)
MCV: 90 fL (ref 79–97)
Monocytes Absolute: 0.6 10*3/uL (ref 0.1–0.9)
Monocytes: 7 %
Neutrophils Absolute: 5.5 10*3/uL (ref 1.4–7.0)
Neutrophils: 66 %
Platelets: 178 10*3/uL (ref 150–450)
RBC: 4.24 x10E6/uL (ref 3.77–5.28)
RDW: 12.6 % (ref 11.7–15.4)
WBC: 8.3 10*3/uL (ref 3.4–10.8)

## 2022-09-20 LAB — CMP14+EGFR
ALT: 11 IU/L (ref 0–32)
AST: 16 IU/L (ref 0–40)
Albumin/Globulin Ratio: 1.5 (ref 1.2–2.2)
Albumin: 4.3 g/dL (ref 3.8–4.8)
Alkaline Phosphatase: 101 IU/L (ref 44–121)
BUN/Creatinine Ratio: 19 (ref 12–28)
BUN: 17 mg/dL (ref 8–27)
Bilirubin Total: <0.2 mg/dL (ref 0.0–1.2)
CO2: 24 mmol/L (ref 20–29)
Calcium: 10.6 mg/dL — ABNORMAL HIGH (ref 8.7–10.3)
Chloride: 106 mmol/L (ref 96–106)
Creatinine, Ser: 0.88 mg/dL (ref 0.57–1.00)
Globulin, Total: 2.8 g/dL (ref 1.5–4.5)
Glucose: 99 mg/dL (ref 70–99)
Potassium: 4.2 mmol/L (ref 3.5–5.2)
Sodium: 143 mmol/L (ref 134–144)
Total Protein: 7.1 g/dL (ref 6.0–8.5)
eGFR: 68 mL/min/{1.73_m2} (ref >59)

## 2022-09-20 LAB — LIPID PANEL
Chol/HDL Ratio: 3.9 ratio (ref 0.0–4.4)
Cholesterol, Total: 207 mg/dL — ABNORMAL HIGH (ref 100–199)
HDL: 53 mg/dL (ref >39)
LDL Chol Calc (NIH): 130 mg/dL — ABNORMAL HIGH (ref 0–99)
Triglycerides: 135 mg/dL (ref 0–149)
VLDL Cholesterol Cal: 24 mg/dL (ref 5–40)

## 2022-11-26 DIAGNOSIS — U071 COVID-19: Secondary | ICD-10-CM | POA: Diagnosis not present

## 2022-12-01 ENCOUNTER — Encounter: Payer: Self-pay | Admitting: Gastroenterology

## 2023-01-04 ENCOUNTER — Encounter: Payer: Self-pay | Admitting: Gastroenterology

## 2023-01-04 ENCOUNTER — Ambulatory Visit: Payer: Medicare HMO | Admitting: Gastroenterology

## 2023-01-04 VITALS — BP 126/78 | HR 84 | Ht 65.0 in | Wt 180.0 lb

## 2023-01-04 DIAGNOSIS — K921 Melena: Secondary | ICD-10-CM | POA: Diagnosis not present

## 2023-01-04 DIAGNOSIS — Z86010 Personal history of colonic polyps: Secondary | ICD-10-CM

## 2023-01-04 DIAGNOSIS — R197 Diarrhea, unspecified: Secondary | ICD-10-CM

## 2023-01-04 MED ORDER — NA SULFATE-K SULFATE-MG SULF 17.5-3.13-1.6 GM/177ML PO SOLN: 1.0000 | Freq: Once | ORAL | 0 refills | Status: AC

## 2023-01-04 NOTE — Patient Instructions (Addendum)
_A high fiber diet with plenty of fluids (up to 8 glasses of water daily) is suggested to relieve these symptoms.  Citrucel, 1 tablespoon once or twice daily can be used to keep bowels regular if needed.  Imodium as needed (1 tablet) ____________________________________________________  If your blood pressure at your visit was 140/90 or greater, please contact your primary care physician to follow up on this.  _______________________________________________________  If you are age 19 or older, your body mass index should be between 23-30. Your Body mass index is 29.95 kg/m. If this is out of the aforementioned range listed, please consider follow up with your Primary Care Provider.  If you are age 38 or younger, your body mass index should be between 19-25. Your Body mass index is 29.95 kg/m. If this is out of the aformentioned range listed, please consider follow up with your Primary Care Provider.   ________________________________________________________  The [facility] providers would like to encourage you to use [facility] to communicate with providers for non-urgent requests or questions.  Due to long hold times on the telephone, sending your provider a message by [facility] may be a faster and more efficient way to get a response.  Please allow 48 business hours for a response.  Please remember that this is for non-urgent requests.  _______________________________________________________  Dennis Bast have been scheduled for a colonoscopy. Please follow written instructions given to you at your visit today.  Please pick up your prep supplies at the pharmacy within the next 1-3 days. If you use inhalers (even only as needed), please bring them with you on the day of your procedure.

## 2023-01-04 NOTE — Progress Notes (Signed)
HPI : Ariana Dillon is a very pleasant 77 year old female with history of hypertension referred to [place] by Chevis Pretty, FNP for further evaluation hematochezia.  She also has problems with chronic diarrhea and was recently treated for presumptive diverticulitis in September 2023.  She says that she has had problems with diarrhea for about 3 years now.  Her bowel movements are always clustered in the morning and she will usually have 3-4 bowel movements before lunch.  Stools are usually mushy or watery in consistency.  She denies urgency or incontinence.  No abdominal pain.  Constipation is never an issue for her. She has tried taking Metamucil in the past, but it made her vomit.  She has been seeing blood in the stool and toilet paper for several months now and has attributed it to hemorrhoids, although she has no other hemorrhoid symptoms such as perianal swelling, discomfort, itching/burning or prolapse.  She says that she uses Witchhazel pads and this reduces her bleeding.  In September she developed abrupt onset left lower quadrant abdominal pain.  She presented to Urgent Care and was treated presumptively for diverticulitis with oral antibiotics.  Her pain improved quickly thereafter.  Her last colonoscopy was in 2017 and showed left sided diverticulosis, otherwise normal.  She does have a history of tubular adenomas in the past (details not known).   Colonoscopy: Aug 29, 2016 Dr. Amedeo Plenty Indication:  History of polyps Left sided diverticulosis, otherwise normal  Recommended repeat colonoscopy in 3-5 years   Colonoscopy 2002  Tubular adenomas (pathology report only)  Past Medical History:  Diagnosis Date   Colon polyps    Diverticulosis    Hypertension      Past Surgical History:  Procedure Laterality Date   BREAST CYST ASPIRATION Right    Family History  Problem Relation Age of Onset   Diabetes Mother    Alzheimer's disease Mother    Cancer Father        lung    Polymyositis Sister    Breast cancer Maternal Aunt    Heart disease Brother    Heart disease Brother    Heart disease Brother    Social History   Tobacco Use   Smoking status: Never   Smokeless tobacco: Never  Vaping Use   Vaping Use: Never used  Substance Use Topics   Alcohol use: No   Drug use: No   Current Outpatient Medications  Medication Sig Dispense Refill   lisinopril-hydrochlorothiazide (ZESTORETIC) 20-12.5 MG tablet Take 1 tablet by mouth daily. 90 tablet 1   Omega-3 Fatty Acids (FISH OIL) 1000 MG CAPS Take 2 capsules (2,000 mg total) by mouth daily. 180 capsule 1   psyllium (METAMUCIL) 58.6 % packet Take 1 packet by mouth daily. (Patient not taking: Reported on 05/31/2022)     No current facility-administered medications for this visit.   Allergies  Allergen Reactions   Lipitor [Atorvastatin] Other (See Comments)    Myalgia    Penicillins    Sulfa Antibiotics     Itching      Review of Systems: All systems reviewed and negative except where noted in HPI.    No results found.  Physical Exam: BP 126/78   Pulse 84   Ht 5\' 5"  (1.651 m)   Wt 180 lb (81.6 kg)   BMI 29.95 kg/m  Constitutional: Pleasant,well-developed, Caucasian female in no acute distress. HEENT: Normocephalic and atraumatic. Conjunctivae are normal. No scleral icterus. Neck supple.  Cardiovascular: Normal rate, regular rhythm.  Pulmonary/chest: Effort normal and breath sounds normal. No wheezing, rales or rhonchi. Abdominal: Soft, nondistended, nontender. Bowel sounds active throughout. There are no masses palpable. No hepatomegaly. Extremities: no edema Neurological: Alert and oriented to person place and time. Skin: Skin is warm and dry. No rashes noted. Psychiatric: Normal mood and affect. Behavior is normal.  CBC    Component Value Date/Time   WBC 8.3 09/19/2022 1511   WBC 7.1 10/27/2014 1220   RBC 4.24 09/19/2022 1511   RBC 4.9 10/27/2014 1220   HGB 13.1 09/19/2022 1511    HCT 38.1 09/19/2022 1511   PLT 178 09/19/2022 1511   MCV 90 09/19/2022 1511   MCH 30.9 09/19/2022 1511   MCH 27.6 10/27/2014 1220   MCHC 34.4 09/19/2022 1511   MCHC 30.7 (A) 10/27/2014 1220   RDW 12.6 09/19/2022 1511   LYMPHSABS 2.0 09/19/2022 1511   EOSABS 0.1 09/19/2022 1511   BASOSABS 0.1 09/19/2022 1511    CMP     Component Value Date/Time   NA 143 09/19/2022 1511   K 4.2 09/19/2022 1511   CL 106 09/19/2022 1511   CO2 24 09/19/2022 1511   GLUCOSE 99 09/19/2022 1511   BUN 17 09/19/2022 1511   CREATININE 0.88 09/19/2022 1511   CALCIUM 10.6 (H) 09/19/2022 1511   PROT 7.1 09/19/2022 1511   ALBUMIN 4.3 09/19/2022 1511   AST 16 09/19/2022 1511   ALT 11 09/19/2022 1511   ALKPHOS 101 09/19/2022 1511   BILITOT <0.2 09/19/2022 1511   GFRNONAA 60 09/14/2020 1123   GFRAA 69 09/14/2020 1123     ASSESSMENT AND PLAN:  77 year old female with a history of colon polyps (details unknown) and recent onset of painless hematochezia, as well as episode of suspected diverticulitis treated with antibiotics (no CT to confirm).  She also has chronic loose stools.  A colonoscopy is indicated to evaluate her hematochezia and for polyp surveillance.  Will also exclude microscopic colitis as a cause of her diarrhea, although her symptoms do not seem very consistent with it (no urgency, limited to morning only).   I recommended she try taking another fiber formulation such as Citrucel to bulk up the stool, and to increase her dietary fiber as well.  She can take loperamide as for her diarrhea when she needs to be out of the house in the morning.  Diarrhea - Citrucel, increase dietary fiber - Colonoscopy with random biopsies - Loperamide as needed  History of colon polyps - Colonoscopy  Hematochezia, likely hemorrhoidal - Colonoscopy  The details, risks (including bleeding, perforation, infection, missed lesions, medication reactions and possible hospitalization or surgery if complications  occur), benefits, and alternatives to colonoscopy with possible biopsy and possible polypectomy were discussed with the patient and she consents to proceed.   Bellamy Judson E. Candis Schatz, MD [facility]  CC:  Hassell Done, Mary-Margaret, *

## 2023-02-14 ENCOUNTER — Encounter: Payer: Self-pay | Admitting: Gastroenterology

## 2023-02-24 ENCOUNTER — Other Ambulatory Visit: Payer: Self-pay

## 2023-02-24 ENCOUNTER — Other Ambulatory Visit: Payer: Self-pay | Admitting: Gastroenterology

## 2023-02-24 ENCOUNTER — Encounter: Payer: Self-pay | Admitting: Gastroenterology

## 2023-02-24 ENCOUNTER — Ambulatory Visit (AMBULATORY_SURGERY_CENTER): Payer: Medicare HMO | Admitting: Gastroenterology

## 2023-02-24 VITALS — BP 125/67 | HR 58 | Temp 97.8°F | Resp 15 | Ht 65.0 in | Wt 180.0 lb

## 2023-02-24 DIAGNOSIS — D120 Benign neoplasm of cecum: Secondary | ICD-10-CM

## 2023-02-24 DIAGNOSIS — D123 Benign neoplasm of transverse colon: Secondary | ICD-10-CM

## 2023-02-24 DIAGNOSIS — Z86010 Personal history of colonic polyps: Secondary | ICD-10-CM

## 2023-02-24 DIAGNOSIS — R197 Diarrhea, unspecified: Secondary | ICD-10-CM

## 2023-02-24 DIAGNOSIS — I10 Essential (primary) hypertension: Secondary | ICD-10-CM | POA: Diagnosis not present

## 2023-02-24 DIAGNOSIS — K920 Hematemesis: Secondary | ICD-10-CM | POA: Diagnosis not present

## 2023-02-24 DIAGNOSIS — N8110 Cystocele, unspecified: Secondary | ICD-10-CM

## 2023-02-24 MED ORDER — SODIUM CHLORIDE 0.9 % IV SOLN
500.0000 mL | INTRAVENOUS | Status: DC
Start: 2023-02-24 — End: 2024-06-03

## 2023-02-24 NOTE — Progress Notes (Signed)
Vss nad trans to pacu 

## 2023-02-24 NOTE — Op Note (Signed)
[facility] Patient Name: Ariana Dillon Procedure Date: 02/24/2023 3:13 PM MRN: [id number] Endoscopist: Lorin Picket E. Tomasa Rand , MD, [id number] Age: 77 Referring MD:  Date of Birth: Feb 08, 1946 Gender: Female Account #: [id number] Procedure:                Colonoscopy Indications:              Chronic diarrhea, Hematochezia, Follow-up of                            diverticulitis Medicines:                Monitored Anesthesia Care Procedure:                Pre-Anesthesia Assessment:                           - Prior to the procedure, a History and Physical                            was performed, and patient medications and                            allergies were reviewed. The patient's tolerance of                            previous anesthesia was also reviewed. The risks                            and benefits of the procedure and the sedation                            options and risks were discussed with the patient.                            All questions were answered, and informed consent                            was obtained. Prior Anticoagulants: The patient has                            taken no anticoagulant or antiplatelet agents. ASA                            Grade Assessment: II - A patient with mild systemic                            disease. After reviewing the risks and benefits,                            the patient was deemed in satisfactory condition to                            undergo the procedure.  After obtaining informed consent, the colonoscope                            was passed under direct vision. Throughout the                            procedure, the patient's blood pressure, pulse, and                            oxygen saturations were monitored continuously. The                            [id number]-HQ190L Colonoscope [id number] was introduced                            through the anus and advanced to the the terminal                             ileum, with identification of the appendiceal                            orifice and IC valve. The colonoscopy was performed                            without difficulty. The patient tolerated the                            procedure well. The quality of the bowel                            preparation was good. The terminal ileum, ileocecal                            valve, appendiceal orifice, and rectum were                            photographed. The bowel preparation used was SUPREP                            via split dose instruction. Scope In: 3:23:00 PM Scope Out: 3:46:03 PM Scope Withdrawal Time: 0 hours 14 minutes 7 seconds  Total Procedure Duration: 0 hours 23 minutes 3 seconds  Findings:                 Hemorrhoids/skin tags were found on perianal exam.                           The perianal exam findings include vaginal                            prolapse. This was able to be manually reduced                           The digital rectal exam was normal. Pertinent  negatives include normal sphincter tone and no                            palpable rectal lesions.                           A 3 mm polyp was found in the cecum. The polyp was                            sessile. The polyp was removed with a cold snare.                            Resection and retrieval were complete. Estimated                            blood loss was minimal.                           Two sessile polyps were found in the transverse                            colon. The polyps were 2 to 3 mm in size. These                            polyps were removed with a cold snare. Resection                            and retrieval were complete. Estimated blood loss                            was minimal.                           Normal mucosa was found in the entire colon.                            Biopsies for histology were taken with a cold                             forceps from the ascending colon, transverse colon,                            descending colon and sigmoid colon for evaluation                            of microscopic colitis. Estimated blood loss was                            minimal.                           Many large-mouthed, medium-mouthed and  small-mouthed diverticula were found in the sigmoid                            colon, descending colon, transverse colon and                            ascending colon. There was narrowing of the colon                            in association with the diverticular opening. There                            was evidence of an impacted diverticulum.                           The exam was otherwise normal throughout the                            examined colon.                           The terminal ileum appeared normal.                           Non-bleeding internal hemorrhoids were found during                            retroflexion. The hemorrhoids were Grade I                            (internal hemorrhoids that do not prolapse).                           No additional abnormalities were found on                            retroflexion. Complications:            No immediate complications. Estimated Blood Loss:     Estimated blood loss was minimal. Impression:               - Hemorrhoids found on perianal exam. This is the                            source of the patient's hematochezia.                           - Vaginal prolapse found on perianal exam.                           - One 3 mm polyp in the cecum, removed with a cold                            snare. Resected and retrieved.                           -  Two 2 to 3 mm polyps in the transverse colon,                            removed with a cold snare. Resected and retrieved.                           - Normal mucosa in the entire examined colon.                             Biopsied.                           - Moderate diverticulosis in the sigmoid colon, in                            the descending colon, in the transverse colon and                            in the ascending colon. There was narrowing of the                            colon in association with the diverticular opening.                            There was evidence of an impacted diverticulum.                           - The examined portion of the ileum was normal.                           - Non-bleeding internal hemorrhoids. Recommendation:           - Patient has a contact number available for                            emergencies. The signs and symptoms of potential                            delayed complications were discussed with the                            patient. Return to normal activities tomorrow.                            Written discharge instructions were provided to the                            patient.                           - Resume previous diet.                           - Continue present medications.                           -  Await pathology results.                           - Given patient's age and lack of high risk polyps,                            I recommend against further colon cancer                            screening/polyp surveillance.                           - Recommend high fiber diet/daily fiber supplement                            to reduce risk of diverticulitis, improve stool                            bulk and reduce hemorrhoidal bleeding.                           - Follow up with gynecology regarding vaginal                            prolapse. Nicolaos Mitrano E. Tomasa Rand, MD 02/24/2023 3:58:07 PM This report has been signed electronically.

## 2023-02-24 NOTE — Patient Instructions (Signed)
YOU HAD AN ENDOSCOPIC PROCEDURE TODAY AT THE [facility]:   Refer to the procedure report that was given to you for any specific questions about what was found during the examination.  If the procedure report does not answer your questions, please call your gastroenterologist to clarify.  If you requested that your care partner not be given the details of your procedure findings, then the procedure report has been included in a sealed envelope for you to review at your convenience later.  YOU SHOULD EXPECT: Some feelings of bloating in the abdomen. Passage of more gas than usual.  Walking can help get rid of the air that was put into your GI tract during the procedure and reduce the bloating. If you had a lower endoscopy (such as a colonoscopy or flexible sigmoidoscopy) you may notice spotting of blood in your stool or on the toilet paper. If you underwent a bowel prep for your procedure, you may not have a normal bowel movement for a few days.  Please Note:  You might notice some irritation and congestion in your nose or some drainage.  This is from the oxygen used during your procedure.  There is no need for concern and it should clear up in a day or so.  SYMPTOMS TO REPORT IMMEDIATELY:  Following lower endoscopy (colonoscopy or flexible sigmoidoscopy):  Excessive amounts of blood in the stool  Significant tenderness or worsening of abdominal pains  Swelling of the abdomen that is new, acute  Fever of 100F or higher   For urgent or emergent issues, a gastroenterologist can be reached at any hour by calling [phone]. Do not use MyChart messaging for urgent concerns.    DIET:  Recommend high fiber diet and daily fiber supplement to reduce risk of diverticulosis, improve stool bulk and reduce hemorrhoidal bleeding (such as Metamucil powder or FiberCon capsules). We do recommend a small meal at first, but then you may proceed to your regular diet.  Drink plenty of fluids but you  should avoid alcoholic beverages for 24 hours.  MEDICATIONS: Continue present medications.  Please see handouts given to you by your recovery nurse: Polyps, Diverticulosis, Hemorrhoids, High Fiber Diet.  FOLLOW UP: Due to age and lack of high risk polyps, recommend against further colon cancer screening/polyp surveillance. Follow up with gynecology regarding vaginal prolapse.  Thank you for allowing [place] to provide for your healthcare needs today.  ACTIVITY:  You should plan to take it easy for the rest of today and you should NOT DRIVE or use heavy machinery until tomorrow (because of the sedation medicines used during the test).    FOLLOW UP: Our staff will call the number listed on your records the next business day following your procedure.  We will call around 7:15- 8:00 am to check on you and address any questions or concerns that you may have regarding the information given to you following your procedure. If we do not reach you, we will leave a message.     If any biopsies were taken you will be contacted by phone or by letter within the next 1-3 weeks.  Please call us at [phone] if you have not heard about the biopsies in 3 weeks.    SIGNATURES/CONFIDENTIALITY: You and/or your care partner have signed paperwork which will be entered into your electronic medical record.  These signatures attest to the fact that that the information above on your After Visit Summary has been reviewed and is understood.  Full  responsibility of the confidentiality of this discharge information lies with you and/or your care-partner.

## 2023-02-24 NOTE — Progress Notes (Signed)
Called to room to assist during endoscopic procedure.  Patient ID and intended procedure confirmed with present staff. Received instructions for my participation in the procedure from the performing physician.  

## 2023-02-24 NOTE — Progress Notes (Signed)
[facility] History and Physical   Primary Care Physician:  Bennie Pierini, FNP   Reason for Procedure:   Diarrhea, hematochezia  Plan:    Colonoscopy     HPI: Ariana Dillon is a 77 y.o. female undergoing colonoscopy to evaluate chronic diarrhea and new hematochezia.  She was recently diagnosed and treated for diverticulitis.  She has a history of colon polyps and left sided diverticulosis.    Past Medical History:  Diagnosis Date   Colon polyps    Diverticulosis    Hypertension     Past Surgical History:  Procedure Laterality Date   BREAST CYST ASPIRATION Right     Prior to Admission medications   Medication Sig Start Date End Date Taking? Authorizing Provider  lisinopril-hydrochlorothiazide (ZESTORETIC) 20-12.5 MG tablet Take 1 tablet by mouth daily. 09/19/22  Yes Martin, Mary-Margaret, FNP  Omega-3 Fatty Acids (FISH OIL) 1000 MG CAPS Take 2 capsules (2,000 mg total) by mouth daily. 09/19/22   Daphine Deutscher, Mary-Margaret, FNP  psyllium (METAMUCIL) 58.6 % packet Take 1 packet by mouth daily. Patient not taking: Reported on 05/31/2022    [provider]    Current Outpatient Medications  Medication Sig Dispense Refill   lisinopril-hydrochlorothiazide (ZESTORETIC) 20-12.5 MG tablet Take 1 tablet by mouth daily. 90 tablet 1   Omega-3 Fatty Acids (FISH OIL) 1000 MG CAPS Take 2 capsules (2,000 mg total) by mouth daily. 180 capsule 1   psyllium (METAMUCIL) 58.6 % packet Take 1 packet by mouth daily. (Patient not taking: Reported on 05/31/2022)     Current Facility-Administered Medications  Medication Dose Route Frequency Provider Last Rate Last Admin   0.9 %  sodium chloride infusion  500 mL Intravenous Continuous Jenel Lucks, MD        Allergies as of 02/24/2023 - Review Complete 02/24/2023  Allergen Reaction Noted   Lipitor [atorvastatin] Other (See Comments) 10/09/2013   Penicillins  04/30/2013   Sulfa antibiotics  11/11/2018    Family  History  Problem Relation Age of Onset   Diabetes Mother    Alzheimer's disease Mother    Cancer Father        lung   Polymyositis Sister    Heart disease Brother    Heart disease Brother    Heart disease Brother    Breast cancer Maternal Aunt    Colon cancer Neg Hx    Colon polyps Neg Hx    Esophageal cancer Neg Hx    Rectal cancer Neg Hx    Stomach cancer Neg Hx     Social History   Socioeconomic History   Marital status: Widowed    Spouse name: Not on file   Number of children: 1   Years of education: Not on file   Highest education level: Not on file  Occupational History   Occupation: retired   Occupation: retired  Tobacco Use   Smoking status: Never   Smokeless tobacco: Never  Vaping Use   Vaping Use: Never used  Substance and Sexual Activity   Alcohol use: No   Drug use: No   Sexual activity: Not on file  Other Topics Concern   Not on file  Social History Narrative   Lives alone - husband passed from [place] in 2021   Daughter lives close by   Social Determinants of Health   Financial Resource Strain: Low Risk  (05/31/2022)   Overall Financial Resource Strain (CARDIA)    Difficulty of Paying Living Expenses: Not hard at all  Food Insecurity:  No Food Insecurity (05/31/2022)   Hunger Vital Sign    Worried About Running Out of Food in the Last Year: Never true    Ran Out of Food in the Last Year: Never true  Transportation Needs: No Transportation Needs (05/31/2022)   PRAPARE - Administrator, Civil Service (Medical): No    Lack of Transportation (Non-Medical): No  Physical Activity: Sufficiently Active (05/31/2022)   Exercise Vital Sign    Days of Exercise per Week: 3 days    Minutes of Exercise per Session: 60 min  Stress: No Stress Concern Present (05/31/2022)   [employer] of Occupational Health - Occupational Stress Questionnaire    Feeling of Stress : Not at all  Social Connections: Moderately Integrated (05/31/2022)   Social Connection  and Isolation Panel [NHANES]    Frequency of Communication with Friends and Family: More than three times a week    Frequency of Social Gatherings with Friends and Family: More than three times a week    Attends Religious Services: More than 4 times per year    Active Member of [employer] or Organizations: Yes    Attends Banker Meetings: More than 4 times per year    Marital Status: Widowed  Intimate Partner Violence: Not At Risk (05/31/2022)   Humiliation, Afraid, Rape, and Kick questionnaire    Fear of Current or Ex-Partner: No    Emotionally Abused: No    Physically Abused: No    Sexually Abused: No    Review of Systems:  All other review of systems negative except as mentioned in the HPI.  Physical Exam: Vital signs BP 134/64   Pulse 72   Temp 97.8 F (36.6 C) (Temporal)   Ht 5\' 5"  (1.651 m)   Wt 180 lb (81.6 kg)   SpO2 95%   BMI 29.95 kg/m   General:   Alert,  Well-developed, well-nourished, pleasant and cooperative in NAD Airway:  Mallampati 2 Lungs:  Clear throughout to auscultation.   Heart:  Regular rate and rhythm; no murmurs, clicks, rubs,  or gallops. Abdomen:  Soft, nontender and nondistended. Normal bowel sounds.   Neuro/Psych:  Normal mood and affect. A and O x 3   Merriam Brandner E. Tomasa Rand, MD [facility]

## 2023-02-27 ENCOUNTER — Telehealth: Payer: Self-pay

## 2023-02-27 NOTE — Telephone Encounter (Signed)
Attempted to reach patient for post-procedure f/u call. No answer. Left message for her to please not hesitate to call if she has any questions/concerns regarding her care. 

## 2023-03-06 NOTE — Progress Notes (Signed)
Ariana Dillon,   The polyps that I removed during your recent procedure were completely benign but were proven to be "pre-cancerous" polyps that MAY have grown into cancers if they had not been removed.  Studies shows that at least 20% of women over age 77 and 30% of men over age 42 have pre-cancerous polyps.  Based on current nationally recognized surveillance guidelines, I recommend that you have a repeat colonoscopy in 5 years.  However, because colon cancer screening after age 15 is done on a case-by-case basis, taking into account the patient's risk factors for colon cancer, as well as comorbidities and life expectancy, I would recommend against any further colon cancer screening given the absence of any high risk polyps on this colonoscopy.  The biopsies taken from your colon were normal and there was no evidence of Microscopic Colitis or chronic inflammatory changes.  You can follow up in our office as needed for further management of your diarrhea    If you develop any new rectal bleeding, abdominal pain or significant bowel habit changes, please contact me before then.

## 2023-04-01 ENCOUNTER — Other Ambulatory Visit: Payer: Self-pay | Admitting: Nurse Practitioner

## 2023-04-01 DIAGNOSIS — E782 Mixed hyperlipidemia: Secondary | ICD-10-CM

## 2023-04-03 ENCOUNTER — Ambulatory Visit: Payer: Medicare HMO | Admitting: Nurse Practitioner

## 2023-04-03 DIAGNOSIS — M79662 Pain in left lower leg: Secondary | ICD-10-CM | POA: Diagnosis not present

## 2023-04-03 DIAGNOSIS — M79605 Pain in left leg: Secondary | ICD-10-CM | POA: Diagnosis not present

## 2023-04-03 DIAGNOSIS — M25562 Pain in left knee: Secondary | ICD-10-CM | POA: Diagnosis not present

## 2023-04-03 DIAGNOSIS — M7122 Synovial cyst of popliteal space [Baker], left knee: Secondary | ICD-10-CM | POA: Diagnosis not present

## 2023-04-03 DIAGNOSIS — I10 Essential (primary) hypertension: Secondary | ICD-10-CM | POA: Diagnosis not present

## 2023-04-03 DIAGNOSIS — M7989 Other specified soft tissue disorders: Secondary | ICD-10-CM | POA: Diagnosis not present

## 2023-04-04 DIAGNOSIS — M7989 Other specified soft tissue disorders: Secondary | ICD-10-CM | POA: Diagnosis not present

## 2023-04-04 DIAGNOSIS — M79605 Pain in left leg: Secondary | ICD-10-CM | POA: Diagnosis not present

## 2023-04-06 ENCOUNTER — Ambulatory Visit (INDEPENDENT_AMBULATORY_CARE_PROVIDER_SITE_OTHER): Payer: Medicare HMO | Admitting: Nurse Practitioner

## 2023-04-06 ENCOUNTER — Encounter: Payer: Self-pay | Admitting: Nurse Practitioner

## 2023-04-06 ENCOUNTER — Telehealth: Payer: Self-pay | Admitting: Nurse Practitioner

## 2023-04-06 VITALS — BP 153/79 | HR 77 | Temp 97.4°F | Resp 20 | Ht 65.0 in | Wt 180.0 lb

## 2023-04-06 DIAGNOSIS — M7122 Synovial cyst of popliteal space [Baker], left knee: Secondary | ICD-10-CM | POA: Diagnosis not present

## 2023-04-06 MED ORDER — KETOROLAC TROMETHAMINE 60 MG/2ML IM SOLN
60.0000 mg | Freq: Once | INTRAMUSCULAR | Status: AC
Start: 2023-04-06 — End: 2023-04-06
  Administered 2023-04-06: 60 mg via INTRAMUSCULAR

## 2023-04-06 MED ORDER — METHYLPREDNISOLONE ACETATE 40 MG/ML IJ SUSP
80.0000 mg | Freq: Once | INTRAMUSCULAR | Status: AC
Start: 2023-04-06 — End: 2023-04-06
  Administered 2023-04-06: 80 mg via INTRAMUSCULAR

## 2023-04-06 MED ORDER — PREDNISONE 20 MG PO TABS
ORAL_TABLET | ORAL | 0 refills | Status: DC
Start: 2023-04-06 — End: 2023-05-12

## 2023-04-06 NOTE — Telephone Encounter (Signed)
Patient was seen in the ED on 6/10 at [facility] for leg pain and said that she refused medication there but is now wanting medication for the pain in her leg. She said she refused it because she has a job and did not want a strong pain medication. Made patient aware that if she wanted a medication she would need to come in for an appt and she did not want to make one, wanted to talk to PCP nurse about what she could do for pain. Stated that she took ibuprofen this morning and it is not helping. Please call back and advise.

## 2023-04-06 NOTE — Progress Notes (Signed)
   Subjective:    Patient ID: Ariana Dillon, female    DOB: Apr 09, 1946, 77 y.o.   MRN: [id number]   Chief Complaint: Hospitalization Follow-up   HPI Patient went to urgent care with left leg pain they performed a doppler study on her which was negative for DVT. SHe was told she has a bakers cyst. She rates her pain 9/10 when walking, just aches when she is sitting. She has taken motrin with no pain relief.    Patient Active Problem List   Diagnosis Date Noted   BMI 29.0-29.9,adult 05/01/2015   MIXED HYPERLIPIDEMIA 01/14/2009   ESSENTIAL HYPERTENSION, BENIGN 01/14/2009   SYSTOLIC MURMUR 01/14/2009       Review of Systems  Constitutional:  Negative for diaphoresis.  Eyes:  Negative for pain.  Respiratory:  Negative for shortness of breath.   Cardiovascular:  Negative for chest pain, palpitations and leg swelling.  Gastrointestinal:  Negative for abdominal pain.  Endocrine: Negative for polydipsia.  Skin:  Negative for rash.  Neurological:  Negative for dizziness, weakness and headaches.  Hematological:  Does not bruise/bleed easily.  All other systems reviewed and are negative.      Objective:   Physical Exam Constitutional:      Appearance: Normal appearance.  Cardiovascular:     Rate and Rhythm: Normal rate and regular rhythm.     Heart sounds: Normal heart sounds.  Pulmonary:     Effort: Pulmonary effort is normal.     Breath sounds: Normal breath sounds.  Musculoskeletal:     Comments: Pain on palpation of left posterior knee with mild edema   No palpable cyst posterior left knee  Neurological:     Mental Status: She is alert.     BP (!) 153/79   Pulse 77   Temp (!) 97.4 F (36.3 C) (Temporal)   Resp 20   Ht 5\' 5"  (1.651 m)   Wt 180 lb (81.6 kg)   SpO2 97%   BMI 29.95 kg/m        Assessment & Plan:   Ariana Dillon in today with chief complaint of Hospitalization Follow-up   1. Baker's cyst of knee, left Ice BID RTO prn - methylPREDNISolone  acetate (DEPO-MEDROL) injection 80 mg - ketorolac (TORADOL) injection 60 mg - predniSONE (DELTASONE) 20 MG tablet; 2 po at sametime daily for 5 days-  Dispense: 10 tablet; Refill: 0    The above assessment and management plan was discussed with the patient. The patient verbalized understanding of and has agreed to the management plan. Patient is aware to call the clinic if symptoms persist or worsen. Patient is aware when to return to the clinic for a follow-up visit. Patient educated on when it is appropriate to go to the emergency department.   Mary-Margaret Daphine Deutscher, FNP

## 2023-04-06 NOTE — Telephone Encounter (Signed)
Patient currently on the schedule for a visit today

## 2023-04-06 NOTE — Patient Instructions (Signed)
Baker Cyst  A Baker cyst, also called a popliteal cyst, is a growth that forms at the back of the knee. The cyst forms when the fluid-filled sac (bursa) behind the knee joint fills up with more fluid and becomes enlarged. What are the causes? In most cases, a Baker cyst results from another knee problem that causes swelling in the knee. This makes the fluid inside the knee joint (synovial fluid) flow into the bursa behind the knee, causing the bursa to enlarge. The fluid flows in one direction and is unable to move back into the joint. What increases the risk? You may be more likely to develop a Baker cyst if you already have a knee problem, such as: A tear in cartilage that cushions the knee joint (meniscal tear). A tear in the tissues that connect the bones of the knee joint (ligament tear). Knee swelling from osteoarthritis, rheumatoid arthritis, or gout. What are the signs or symptoms? The main symptom of this condition is a lump behind the knee. This may be the only symptom of the condition. The lump may be painful, especially when the knee is straightened. If the lump is painful, the pain may come and go. The knee may also be stiff. Symptoms may quickly get more severe if the cyst breaks open. If the cyst breaks open, you may feel the following in your knee and calf: Sudden or worsening pain. Swelling. Bruising. Redness in the calf. A Baker cyst does not always cause symptoms. How is this diagnosed? This condition may be diagnosed based on your symptoms and medical history. Your health care provider will also do a physical exam. This may include: Feeling the cyst to check whether it is tender. Checking your knee for signs of another knee condition that causes swelling. You may have imaging tests, such as X-ray, ultrasound, or MRI. How is this treated? A Baker cyst that is not painful may go away without treatment. If the cyst gets large or painful, it will likely get better if the  underlying knee problem is treated. If needed, treatment for a Baker cyst may include: Resting. Keeping weight off the knee. This means not leaning on the knee to support your body weight. Taking NSAIDs, such as ibuprofen, to reduce pain and swelling. Draining the fluid from the cyst with a needle (aspiration). Getting a steroid injection into the knee. A steroid reduces swelling. Surgery. This may be needed if other treatments do not work. This usually involves correcting knee damage and removing the cyst. In some cases, you may also need to do certain exercises to improve movement in the knee (physical therapy). Follow these instructions at home: Activity Rest as told by your provider. Avoid activities that make pain or swelling worse. Do not use the injured limb to support your body weight until your provider says that you can. Use crutches as told by your provider. Raise (elevate) your knee above the level of your heart while you are sitting or lying down. Do physical therapy exercises as told by your provider. Return to your normal activities as told by your provider. Ask your provider what activities are safe for you. General instructions Take over-the-counter and prescription medicines only as told by your provider. Keep all follow-up visits. Your provider will monitor your healing and adjust your treatment as needed. Contact a health care provider if: You have knee pain, stiffness, or swelling that does not get better. Get help right away if: You have sudden or worsening pain and  swelling in your calf area. This information is not intended to replace advice given to you by your health care provider. Make sure you discuss any questions you have with your health care provider. Document Revised: 06/08/2022 Document Reviewed: 06/08/2022 Elsevier Patient Education  2024 [employer].

## 2023-04-10 ENCOUNTER — Other Ambulatory Visit (HOSPITAL_COMMUNITY): Payer: Self-pay

## 2023-04-17 ENCOUNTER — Telehealth: Payer: Self-pay | Admitting: Nurse Practitioner

## 2023-04-17 ENCOUNTER — Other Ambulatory Visit: Payer: Self-pay

## 2023-04-17 DIAGNOSIS — M7122 Synovial cyst of popliteal space [Baker], left knee: Secondary | ICD-10-CM

## 2023-04-17 NOTE — Telephone Encounter (Signed)
All we can do is refer her to orth if it is still bothering her

## 2023-04-17 NOTE — Telephone Encounter (Signed)
Please review and advise.

## 2023-04-17 NOTE — Telephone Encounter (Signed)
Patient states that she cannot feel the bakers cyst behind her left knee any more but is now having a stabbling pain in the front of her knee and her left lower leg and foot is swollen. Already ruled out DVT so referred to ortho urgently. Patient notified and verbalized understanding

## 2023-04-17 NOTE — Telephone Encounter (Signed)
Patient calling because the bakers cyst she had is better but she feels like it has moved, wanted to know if that is normal. Offered an appt and she did not want to come back in at this time. Please call back and advise.

## 2023-04-26 ENCOUNTER — Ambulatory Visit (INDEPENDENT_AMBULATORY_CARE_PROVIDER_SITE_OTHER): Payer: Medicare HMO | Admitting: Orthopedic Surgery

## 2023-04-26 ENCOUNTER — Encounter: Payer: Self-pay | Admitting: Orthopedic Surgery

## 2023-04-26 VITALS — BP 159/78 | HR 69 | Ht 65.0 in | Wt 178.0 lb

## 2023-04-26 DIAGNOSIS — M25562 Pain in left knee: Secondary | ICD-10-CM

## 2023-04-26 DIAGNOSIS — M7122 Synovial cyst of popliteal space [Baker], left knee: Secondary | ICD-10-CM

## 2023-04-26 MED ORDER — IBUPROFEN 600 MG PO TABS: 600.0000 mg | ORAL_TABLET | Freq: Three times a day (TID) | ORAL | 0 refills | Status: DC | PRN

## 2023-04-26 NOTE — Patient Instructions (Signed)
Continue medications as needed.  Compression brace on the knee  Ice the knee to help with swelling and pain  Consider an injection in the knee, if symptoms do not continue to improve.

## 2023-04-26 NOTE — Progress Notes (Signed)
New Patient Visit  Assessment: Ariana Dillon is a 77 y.o. female with the following: 1. Acute pain of left knee 2. Baker's cyst of knee, left   Plan: Ariana Dillon has pain in her left knee.  It has improved.  She states it was severe, and the swelling in her lower leg was worse.  She is not interested in an injection, but will return if not getting better.  She would like a prescription dose of ibuprofen.  This was sent to her pharmacy.  Medication as needed.  Ice her knee as needed.  Follow-up if her knee is not getting better.  Follow-up: Return if symptoms worsen or fail to improve.  Subjective:  Chief Complaint  Patient presents with   Knee Pain    L knee pain swelling fo 2 wks. Had an [place]     History of Present Illness: Ariana Dillon is a 77 y.o. female who has been referred by  Bennie Pierini, FNP for evaluation of left knee pain.  She has had pain in her left knee for the past 2 weeks.  No specific injury.  Pain was worse in the posterior aspect of the knee, and now is bothering her in the anterior and medial aspect of the left knee.  No prior injuries to the left knee.  She has been taking ibuprofen on a regular basis, with limited improvement in her symptoms.  At the time of the ultrasound, she had severe pain, swelling in her distal leg.  Since then, the pain and swelling has improved.   Review of Systems: No fevers or chills No numbness or tingling No chest pain No shortness of breath No bowel or bladder dysfunction No GI distress No headaches   Medical History:  Past Medical History:  Diagnosis Date   Colon polyps    Diverticulosis    Hypertension     Past Surgical History:  Procedure Laterality Date   BREAST CYST ASPIRATION Right     Family History  Problem Relation Age of Onset   Diabetes Mother    Alzheimer's disease Mother    Cancer Father        lung   Polymyositis Sister    Heart disease Brother    Heart disease Brother    Heart disease  Brother    Breast cancer Maternal Aunt    Colon cancer Neg Hx    Colon polyps Neg Hx    Esophageal cancer Neg Hx    Rectal cancer Neg Hx    Stomach cancer Neg Hx    Social History   Tobacco Use   Smoking status: Never   Smokeless tobacco: Never  Vaping Use   Vaping Use: Never used  Substance Use Topics   Alcohol use: No   Drug use: No    Allergies  Allergen Reactions   Lipitor [Atorvastatin] Other (See Comments)    Myalgia    Penicillins    Sulfa Antibiotics     Itching     Current Meds  Medication Sig   ibuprofen (ADVIL) 600 MG tablet Take 1 tablet (600 mg total) by mouth every 8 (eight) hours as needed.   Current Facility-Administered Medications for the 04/26/23 encounter (Office Visit) with Oliver Barre, MD  Medication   0.9 %  sodium chloride infusion    Objective: BP (!) 159/78   Pulse 69   Ht 5\' 5"  (1.651 m)   Wt 178 lb (80.7 kg)   BMI 29.62 kg/m   Physical Exam:  General: Elderly female., Alert and oriented., and No acute distress. Gait: Ambulates with the assistance of a cane.  Left knee with minimal swelling.  Mild diffuse lower leg swelling.  Tenderness over the medial joint line.  Good range of motion.  Negative Lachman.  No increased laxity to varus or valgus stress.  Mild tenderness in the posterior knee.     IMAGING: I personally reviewed images previously obtained in clinic  XR with mild to moderate degenerative changes  Ultrasound with 2 small cysts within the popliteal space.  No DVT   New Medications:  Meds ordered this encounter  Medications   ibuprofen (ADVIL) 600 MG tablet    Sig: Take 1 tablet (600 mg total) by mouth every 8 (eight) hours as needed.    Dispense:  60 tablet    Refill:  0      Oliver Barre, MD  04/26/2023 3:51 PM

## 2023-05-11 ENCOUNTER — Ambulatory Visit: Payer: Medicare HMO | Admitting: Nurse Practitioner

## 2023-05-12 ENCOUNTER — Encounter: Payer: Self-pay | Admitting: Nurse Practitioner

## 2023-05-12 ENCOUNTER — Ambulatory Visit (INDEPENDENT_AMBULATORY_CARE_PROVIDER_SITE_OTHER): Payer: Medicare HMO | Admitting: Nurse Practitioner

## 2023-05-12 VITALS — BP 133/77 | HR 76 | Temp 97.4°F | Resp 20 | Ht 65.0 in | Wt 175.0 lb

## 2023-05-12 DIAGNOSIS — Z6829 Body mass index (BMI) 29.0-29.9, adult: Secondary | ICD-10-CM

## 2023-05-12 DIAGNOSIS — E782 Mixed hyperlipidemia: Secondary | ICD-10-CM

## 2023-05-12 DIAGNOSIS — I10 Essential (primary) hypertension: Secondary | ICD-10-CM

## 2023-05-12 DIAGNOSIS — R600 Localized edema: Secondary | ICD-10-CM | POA: Diagnosis not present

## 2023-05-12 MED ORDER — LISINOPRIL 20 MG PO TABS
20.0000 mg | ORAL_TABLET | Freq: Every day | ORAL | 3 refills | Status: DC
Start: 2023-05-12 — End: 2023-11-13

## 2023-05-12 MED ORDER — FUROSEMIDE 20 MG PO TABS
20.0000 mg | ORAL_TABLET | Freq: Every day | ORAL | 3 refills | Status: DC
Start: 2023-05-12 — End: 2023-08-07

## 2023-05-12 NOTE — Patient Instructions (Signed)
Peripheral Edema  Peripheral edema is swelling that is caused by a buildup of fluid. Peripheral edema most often affects the lower legs, ankles, and feet. It can also develop in the arms, hands, and face. The area of the body that has peripheral edema will look swollen. It may also feel heavy or warm. Your clothes may start to feel tight. Pressing on the area may make a temporary dent in your skin (pitting edema). You may not be able to move your swollen arm or leg as much as usual. There are many causes of peripheral edema. It can happen because of a complication of other conditions such as heart failure, kidney disease, or a problem with your circulation. It also can be a side effect of certain medicines or happen because of an infection. It often happens to women during pregnancy. Sometimes, the cause is not known. Follow these instructions at home: Managing pain, stiffness, and swelling  Raise (elevate) your legs while you are sitting or lying down. Move around often to prevent stiffness and to reduce swelling. Do not sit or stand for long periods of time. Do not wear tight clothing. Do not wear garters on your upper legs. Exercise your legs to get your circulation going. This helps to move the fluid back into your blood vessels, and it may help the swelling go down. Wear compression stockings as told by your health care provider. These stockings help to prevent blood clots and reduce swelling in your legs. It is important that these are the correct size. These stockings should be prescribed by your doctor to prevent possible injuries. If elastic bandages or wraps are recommended, use them as told by your health care provider. Medicines Take over-the-counter and prescription medicines only as told by your health care provider. Your health care provider may prescribe medicine to help your body get rid of excess water (diuretic). Take this medicine if you are told to take it. General  instructions Eat a low-salt (low-sodium) diet as told by your health care provider. Sometimes, eating less salt may reduce swelling. Pay attention to any changes in your symptoms. Moisturize your skin daily to help prevent skin from cracking and draining. Keep all follow-up visits. This is important. Contact a health care provider if: You have a fever. You have swelling in only one leg. You have increased swelling, redness, or pain in one or both of your legs. You have drainage or sores at the area where you have edema. Get help right away if: You have edema that starts suddenly or is getting worse, especially if you are pregnant or have a medical condition. You develop shortness of breath, especially when you are lying down. You have pain in your chest or abdomen. You feel weak. You feel like you will faint. These symptoms may be an emergency. Get help right away. Call 911. Do not wait to see if the symptoms will go away. Do not drive yourself to the hospital. Summary Peripheral edema is swelling that is caused by a buildup of fluid. Peripheral edema most often affects the lower legs, ankles, and feet. Move around often to prevent stiffness and to reduce swelling. Do not sit or stand for long periods of time. Pay attention to any changes in your symptoms. Contact a health care provider if you have edema that starts suddenly or is getting worse, especially if you are pregnant or have a medical condition. Get help right away if you develop shortness of breath, especially when lying down.   This information is not intended to replace advice given to you by your health care provider. Make sure you discuss any questions you have with your health care provider. Document Revised: 06/14/2021 Document Reviewed: 06/14/2021 Elsevier Patient Education  2024 Elsevier Inc.  

## 2023-05-12 NOTE — Progress Notes (Signed)
Subjective:    Patient ID: Ariana Dillon, female    DOB: January 01, 1946, 77 y.o.   MRN: [id number]   Chief Complaint: medical management of chronic issues     HPI:  Ariana Dillon is a 77 y.o. who identifies as a female who was assigned female at birth.   Social history: Lives with: husband Work history: retired   Water engineer in today for follow up of the following chronic medical issues:  1. ESSENTIAL HYPERTENSION, BENIGN No c/o chest pain, sob or headache. Does not check blood pressure at home. BP Readings from Last 3 Encounters:  04/26/23 (!) 159/78  04/06/23 (!) 153/79  02/24/23 125/67     2. MIXED HYPERLIPIDEMIA Does watch diet but doe snot do much exercise. Refuses statin therapy. Lab Results  Component Value Date   CHOL 207 (H) 09/19/2022   HDL 53 09/19/2022   LDLCALC 130 (H) 09/19/2022   TRIG 135 09/19/2022   CHOLHDL 3.9 09/19/2022   The 10-year ASCVD risk score (Arnett DK, et al., 2019) is: 24.9%   3. BMI 29.0-29.9,adult No recent weight changes Wt Readings from Last 3 Encounters:  05/12/23 175 lb (79.4 kg)  04/26/23 178 lb (80.7 kg)  04/06/23 180 lb (81.6 kg)   BMI Readings from Last 3 Encounters:  05/12/23 29.12 kg/m  04/26/23 29.62 kg/m  04/06/23 29.95 kg/m     New complaints: None today  Allergies  Allergen Reactions   Lipitor [Atorvastatin] Other (See Comments)    Myalgia    Penicillins    Sulfa Antibiotics     Itching    Outpatient Encounter Medications as of 05/12/2023  Medication Sig   ibuprofen (ADVIL) 600 MG tablet Take 1 tablet (600 mg total) by mouth every 8 (eight) hours as needed.   lisinopril-hydrochlorothiazide (ZESTORETIC) 20-12.5 MG tablet Take 1 tablet by mouth daily.   Omega 3 1000 MG CAPS TAKE 2 CAPSULES (2,000 MG TOTAL) BY MOUTH DAILY.   predniSONE (DELTASONE) 20 MG tablet 2 po at sametime daily for 5 days-   psyllium (METAMUCIL) 58.6 % packet Take 1 packet by mouth daily.   Facility-Administered Encounter Medications as  of 05/12/2023  Medication   0.9 %  sodium chloride infusion    Past Surgical History:  Procedure Laterality Date   BREAST CYST ASPIRATION Right     Family History  Problem Relation Age of Onset   Diabetes Mother    Alzheimer's disease Mother    Cancer Father        lung   Polymyositis Sister    Heart disease Brother    Heart disease Brother    Heart disease Brother    Breast cancer Maternal Aunt    Colon cancer Neg Hx    Colon polyps Neg Hx    Esophageal cancer Neg Hx    Rectal cancer Neg Hx    Stomach cancer Neg Hx       Controlled substance contract: n/a      Review of Systems  Constitutional:  Negative for diaphoresis.  Eyes:  Negative for pain.  Respiratory:  Negative for shortness of breath.   Cardiovascular:  Negative for chest pain, palpitations and leg swelling.  Gastrointestinal:  Negative for abdominal pain.  Endocrine: Negative for polydipsia.  Skin:  Negative for rash.  Neurological:  Negative for dizziness, weakness and headaches.  Hematological:  Does not bruise/bleed easily.  All other systems reviewed and are negative.      Objective:   Physical Exam Vitals and nursing note  reviewed.  Constitutional:      General: She is not in acute distress.    Appearance: Normal appearance. She is well-developed.  HENT:     Head: Normocephalic.     Right Ear: Tympanic membrane normal.     Left Ear: Tympanic membrane normal.     Nose: Nose normal.     Mouth/Throat:     Mouth: Mucous membranes are moist.  Eyes:     Pupils: Pupils are equal, round, and reactive to light.  Neck:     Vascular: No carotid bruit or JVD.  Cardiovascular:     Rate and Rhythm: Normal rate and regular rhythm.     Heart sounds: Murmur (2/6) heard.  Pulmonary:     Effort: Pulmonary effort is normal. No respiratory distress.     Breath sounds: Normal breath sounds. No wheezing or rales.  Chest:     Chest wall: No tenderness.  Abdominal:     General: Bowel sounds are  normal. There is no distension or abdominal bruit.     Palpations: Abdomen is soft. There is no hepatomegaly, splenomegaly, mass or pulsatile mass.     Tenderness: There is no abdominal tenderness.  Musculoskeletal:        General: Normal range of motion.     Cervical back: Normal range of motion and neck supple.     Right lower leg: Edema (2+) present.     Left lower leg: Edema (2+) present.  Lymphadenopathy:     Cervical: No cervical adenopathy.  Skin:    General: Skin is warm and dry.  Neurological:     Mental Status: She is alert and oriented to person, place, and time.     Deep Tendon Reflexes: Reflexes are normal and symmetric.  Psychiatric:        Behavior: Behavior normal.        Thought Content: Thought content normal.        Judgment: Judgment normal.    BP 133/77   Pulse 76   Temp (!) 97.4 F (36.3 C) (Temporal)   Resp 20   Ht 5\' 5"  (1.651 m)   Wt 175 lb (79.4 kg)   SpO2 93%   BMI 29.12 kg/m         Assessment & Plan:   Ariana Dillon comes in today with chief complaint of Medical Management of Chronic Issues   Diagnosis and orders addressed:  1. ESSENTIAL HYPERTENSION, BENIGN Low sodium diet Stop lisinopril hydrochlorothiazide Start on lisinopril Keep diary of blood sugars - lisinopril (ZESTRIL) 20 MG tablet; Take 1 tablet (20 mg total) by mouth daily.  Dispense: 90 tablet; Refill: 3 - CBC with Differential/Platelet - CMP14+EGFR  2. MIXED HYPERLIPIDEMIA Low fat diet - Lipid panel  3. BMI 29.0-29.9,adult Discussed diet and exercise for person with BMI >25 Will recheck weight in 3-6 months   4. Peripheral edema Started on lasix Elevate legs when sitting - furosemide (LASIX) 20 MG tablet; Take 1 tablet (20 mg total) by mouth daily.  Dispense: 30 tablet; Refill: 3   Labs pending Health Maintenance reviewed Diet and exercise encouraged  Follow up plan: 6 months   Mary-Margaret Daphine Deutscher, FNP

## 2023-05-13 LAB — CMP14+EGFR
ALT: 9 IU/L (ref 0–32)
AST: 16 IU/L (ref 0–40)
Albumin: 4.2 g/dL (ref 3.8–4.8)
Alkaline Phosphatase: 103 IU/L (ref 44–121)
BUN/Creatinine Ratio: 19 (ref 12–28)
BUN: 25 mg/dL (ref 8–27)
Bilirubin Total: <0.2 mg/dL (ref 0.0–1.2)
CO2: 23 mmol/L (ref 20–29)
Calcium: 9.9 mg/dL (ref 8.7–10.3)
Chloride: 103 mmol/L (ref 96–106)
Creatinine, Ser: 1.35 mg/dL — ABNORMAL HIGH (ref 0.57–1.00)
Globulin, Total: 2.4 g/dL (ref 1.5–4.5)
Glucose: 85 mg/dL (ref 70–99)
Potassium: 4.3 mmol/L (ref 3.5–5.2)
Sodium: 141 mmol/L (ref 134–144)
Total Protein: 6.6 g/dL (ref 6.0–8.5)
eGFR: 41 mL/min/{1.73_m2} — ABNORMAL LOW (ref >59)

## 2023-05-13 LAB — CBC WITH DIFFERENTIAL/PLATELET
Basophils Absolute: 0.1 10*3/uL (ref 0.0–0.2)
Basos: 1 %
EOS (ABSOLUTE): 0.1 10*3/uL (ref 0.0–0.4)
Eos: 1 %
Hematocrit: 36.1 % (ref 34.0–46.6)
Hemoglobin: 11.9 g/dL (ref 11.1–15.9)
Immature Grans (Abs): 0 10*3/uL (ref 0.0–0.1)
Immature Granulocytes: 1 %
Lymphocytes Absolute: 2 10*3/uL (ref 0.7–3.1)
Lymphs: 23 %
MCH: 29.8 pg (ref 26.6–33.0)
MCHC: 33 g/dL (ref 31.5–35.7)
MCV: 90 fL (ref 79–97)
Monocytes Absolute: 0.8 10*3/uL (ref 0.1–0.9)
Monocytes: 9 %
Neutrophils Absolute: 5.6 10*3/uL (ref 1.4–7.0)
Neutrophils: 65 %
Platelets: 236 10*3/uL (ref 150–450)
RBC: 4 x10E6/uL (ref 3.77–5.28)
RDW: 12.7 % (ref 11.7–15.4)
WBC: 8.6 10*3/uL (ref 3.4–10.8)

## 2023-05-13 LAB — LIPID PANEL
Chol/HDL Ratio: 3.8 ratio (ref 0.0–4.4)
Cholesterol, Total: 190 mg/dL (ref 100–199)
HDL: 50 mg/dL (ref >39)
LDL Chol Calc (NIH): 121 mg/dL — ABNORMAL HIGH (ref 0–99)
Triglycerides: 104 mg/dL (ref 0–149)
VLDL Cholesterol Cal: 19 mg/dL (ref 5–40)

## 2023-05-30 ENCOUNTER — Other Ambulatory Visit: Payer: Self-pay

## 2023-05-30 DIAGNOSIS — Z780 Asymptomatic menopausal state: Secondary | ICD-10-CM

## 2023-06-21 ENCOUNTER — Ambulatory Visit: Payer: Medicare HMO

## 2023-06-21 VITALS — Ht 65.0 in | Wt 175.0 lb

## 2023-06-21 DIAGNOSIS — Z0000 Encounter for general adult medical examination without abnormal findings: Secondary | ICD-10-CM

## 2023-06-21 NOTE — Patient Instructions (Signed)
Ariana Dillon , Thank you for taking time to come for your Medicare Wellness Visit. I appreciate your ongoing commitment to your health goals. Please review the following plan we discussed and let me know if I can assist you in the future.   Referrals/Orders/Follow-Ups/Clinician Recommendations: Aim for 30 minutes of exercise or brisk walking, 6-8 glasses of water, and 5 servings of fruits and vegetables each day.   This is a list of the screening recommended for you and due dates:  Health Maintenance  Topic Date Due   DEXA scan (bone density measurement)  04/30/2017   COVID-19 Vaccine (4 - 2023-24 season) 06/24/2022   Flu Shot  05/25/2023   Zoster (Shingles) Vaccine (1 of 2) 08/12/2023*   Mammogram  09/20/2023   DTaP/Tdap/Td vaccine (2 - Td or Tdap) 10/10/2023   Medicare Annual Wellness Visit  06/20/2024   Pneumonia Vaccine  Completed   Hepatitis C Screening  Completed   HPV Vaccine  Aged Out   Colon Cancer Screening  Discontinued  *Topic was postponed. The date shown is not the original due date.    Advanced directives: (Provided) Advance directive discussed with you today. I have provided a copy for you to complete at home and have notarized. Once this is complete, please bring a copy in to our office so we can scan it into your chart. Information on Advanced Care Planning can be found at [facility] [place] Advance Health Care Directives Advance Health Care Directives ([email or web address]    Next Medicare Annual Wellness Visit scheduled for next year: Yes  Insert Preventive Care attachment Insert FALL PREVENTION attachment if needed

## 2023-06-21 NOTE — Progress Notes (Unsigned)
Subjective:   Ariana Dillon is a 77 y.o. female who presents for Medicare Annual (Subsequent) preventive examination.  Visit Complete: Virtual  I connected with  Hinton Rao on 06/21/23 by a audio enabled telemedicine application and verified that I am speaking with the correct person using two identifiers.  Patient Location: Home  Provider Location: Home Office  I discussed the limitations of evaluation and management by telemedicine. The patient expressed understanding and agreed to proceed.  Patient Medicare AWV questionnaire was completed by the patient on 06/21/2023; I have confirmed that all information answered by patient is correct and no changes since this date.  Review of Systems    Vital Signs: Unable to obtain new vitals due to this being a telehealth visit.  Cardiac Risk Factors include: advanced age (>54men, >46 women);hypertension     Objective:    Today's Vitals   06/21/23 1536  Weight: 175 lb (79.4 kg)  Height: 5\' 5"  (1.651 m)   Body mass index is 29.12 kg/m.     06/21/2023    3:39 PM 05/31/2022    1:38 PM 05/03/2021    4:12 PM 10/27/2014   11:55 AM  Advanced Directives  Does Patient Have a Medical Advance Directive? No No No No  Would patient like information on creating a medical advance directive? Yes (MAU/Ambulatory/Procedural Areas - Information given) No - Patient declined No - Patient declined Yes - Educational materials given    Current Medications (verified) Outpatient Encounter Medications as of 06/21/2023  Medication Sig   furosemide (LASIX) 20 MG tablet Take 1 tablet (20 mg total) by mouth daily.   ibuprofen (ADVIL) 600 MG tablet Take 1 tablet (600 mg total) by mouth every 8 (eight) hours as needed.   lisinopril (ZESTRIL) 20 MG tablet Take 1 tablet (20 mg total) by mouth daily.   Omega 3 1000 MG CAPS TAKE 2 CAPSULES (2,000 MG TOTAL) BY MOUTH DAILY.   psyllium (METAMUCIL) 58.6 % packet Take 1 packet by mouth daily.   Facility-Administered  Encounter Medications as of 06/21/2023  Medication   0.9 %  sodium chloride infusion    Allergies (verified) Lipitor [atorvastatin], Penicillins, and Sulfa antibiotics   History: Past Medical History:  Diagnosis Date   Colon polyps    Diverticulosis    Hypertension    Past Surgical History:  Procedure Laterality Date   BREAST CYST ASPIRATION Right    Family History  Problem Relation Age of Onset   Diabetes Mother    Alzheimer's disease Mother    Cancer Father        lung   Polymyositis Sister    Heart disease Brother    Heart disease Brother    Heart disease Brother    Breast cancer Maternal Aunt    Colon cancer Neg Hx    Colon polyps Neg Hx    Esophageal cancer Neg Hx    Rectal cancer Neg Hx    Stomach cancer Neg Hx    Social History   Socioeconomic History   Marital status: Widowed    Spouse name: Not on file   Number of children: 1   Years of education: Not on file   Highest education level: Not on file  Occupational History   Occupation: retired   Occupation: retired  Tobacco Use   Smoking status: Never   Smokeless tobacco: Never  Vaping Use   Vaping status: Never Used  Substance and Sexual Activity   Alcohol use: No   Drug use: No  Sexual activity: Not on file  Other Topics Concern   Not on file  Social History Narrative   Lives alone - husband passed from Covid in 2021   Daughter lives close by   Social Determinants of Health   Financial Resource Strain: Low Risk  (06/21/2023)   Overall Financial Resource Strain (CARDIA)    Difficulty of Paying Living Expenses: Not hard at all  Food Insecurity: No Food Insecurity (06/21/2023)   Hunger Vital Sign    Worried About Running Out of Food in the Last Year: Never true    Ran Out of Food in the Last Year: Never true  Transportation Needs: No Transportation Needs (06/21/2023)   PRAPARE - Administrator, Civil Service (Medical): No    Lack of Transportation (Non-Medical): No  Physical  Activity: Sufficiently Active (06/21/2023)   Exercise Vital Sign    Days of Exercise per Week: 5 days    Minutes of Exercise per Session: 30 min  Stress: No Stress Concern Present (06/21/2023)   [employer] of Occupational Health - Occupational Stress Questionnaire    Feeling of Stress : Not at all  Social Connections: Moderately Integrated (06/21/2023)   Social Connection and Isolation Panel [NHANES]    Frequency of Communication with Friends and Family: More than three times a week    Frequency of Social Gatherings with Friends and Family: More than three times a week    Attends Religious Services: More than 4 times per year    Active Member of [employer] or Organizations: Yes    Attends Banker Meetings: More than 4 times per year    Marital Status: Widowed    Tobacco Counseling Counseling given: Not Answered   Clinical Intake:  Pre-visit preparation completed: Yes  Pain : No/denies pain     Nutritional Risks: None Diabetes: No  How often do you need to have someone help you when you read instructions, pamphlets, or other written materials from your doctor or pharmacy?: 1 - Never  Interpreter Needed?: No  Information entered by :: Renie Ora, LPN   Activities of Daily Living    06/21/2023    3:39 PM  In your present state of health, do you have any difficulty performing the following activities:  Hearing? 0  Vision? 0  Difficulty concentrating or making decisions? 0  Walking or climbing stairs? 0  Dressing or bathing? 0  Doing errands, shopping? 0  Preparing Food and eating ? N  Using the Toilet? N  In the past six months, have you accidently leaked urine? N  Do you have problems with loss of bowel control? N  Managing your Medications? N  Managing your Finances? N  Housekeeping or managing your Housekeeping? N    Patient Care Team: Bennie Pierini, FNP as PCP - General (Nurse Practitioner)  Indicate any recent Medical Services you  may have received from other than [facility] providers in the past year (date may be approximate).     Assessment:   This is a routine wellness examination for Ariana Dillon.  Hearing/Vision screen Vision Screening - Comments:: Wears rx glasses - up to date with routine eye exams with  Dr.Johnson   Dietary issues and exercise activities discussed:     Goals Addressed             This Visit's Progress    Patient Stated   On track    05/31/2022 - Hopes to find a part time job - has [facility] talking with  [facility] about this       Depression Screen    06/21/2023    3:38 PM 05/12/2023    2:23 PM 04/06/2023    3:59 PM 09/19/2022    2:44 PM 05/31/2022    1:37 PM 03/14/2022    8:15 AM 09/14/2021    9:37 AM  PHQ 2/9 Scores  PHQ - 2 Score 0 0 1 0 0 0 0  PHQ- 9 Score 0 1 1 0  1 2    Fall Risk    06/21/2023    3:37 PM 05/12/2023    2:24 PM 05/12/2023    2:23 PM 04/06/2023    3:59 PM 09/19/2022    2:44 PM  Fall Risk   Falls in the past year? 0 0 0 0 0  Number falls in past yr: 0      Injury with Fall? 0      Risk for fall due to : No Fall Risks      Follow up Falls prevention discussed        MEDICARE RISK AT HOME: Medicare Risk at Home Any stairs in or around the home?: Yes If so, are there any without handrails?: No Home free of loose throw rugs in walkways, pet beds, electrical cords, etc?: Yes Adequate lighting in your home to reduce risk of falls?: Yes Life alert?: No Use of a cane, walker or w/c?: No Grab bars in the bathroom?: Yes Shower chair or bench in shower?: Yes Elevated toilet seat or a handicapped toilet?: Yes  TIMED UP AND GO:  Was the test performed?  No    Cognitive Function:        06/21/2023    3:40 PM 05/31/2022    1:49 PM 05/03/2021    4:14 PM  6CIT Screen  What Year? 0 points 0 points 0 points  What month? 0 points 0 points 0 points  What time? 0 points 0 points 0 points  Count back from 20 0 points 0 points 0 points  Months in reverse  0 points 0 points 0 points  Repeat phrase 0 points 2 points 0 points  Total Score 0 points 2 points 0 points    Immunizations Immunization History  Administered Date(s) Administered   Fluad Quad(high Dose 65+) 07/24/2019, 07/22/2020, 09/14/2021, 08/06/2022   Influenza, High Dose Seasonal PF 08/10/2018   Influenza,inj,Quad PF,6+ Mos 08/14/2013, 08/12/2014, 08/03/2015, 07/25/2016, 09/07/2017   Influenza-Unspecified 08/28/2017   Moderna SARS-COV2 Booster Vaccination 10/21/2020   Moderna Sars-Covid-2 Vaccination 11/19/2019, 12/17/2019   Pneumococcal Conjugate-13 10/09/2013   Pneumococcal Polysaccharide-23 05/01/2015   Tdap 10/09/2013   Zoster, Live 09/26/2014    TDAP status: Up to date  Flu Vaccine status: Up to date  Pneumococcal vaccine status: Up to date  Covid-19 vaccine status: Completed vaccines  Qualifies for Shingles Vaccine? Yes   Zostavax completed No   Shingrix Completed?: No.    Education has been provided regarding the importance of this vaccine. Patient has been advised to call insurance company to determine out of pocket expense if they have not yet received this vaccine. Advised may also receive vaccine at local pharmacy or Health Dept. Verbalized acceptance and understanding.  Screening Tests Health Maintenance  Topic Date Due   DEXA SCAN  04/30/2017   COVID-19 Vaccine (4 - 2023-24 season) 06/24/2022   INFLUENZA VACCINE  05/25/2023   Zoster Vaccines- Shingrix (1 of 2) 08/12/2023 (Originally [birth year]1996)   MAMMOGRAM  09/20/2023   DTaP/Tdap/Td (2 -  Td or Tdap) 10/10/2023   Medicare Annual Wellness (AWV)  06/20/2024   Pneumonia Vaccine 84+ Years old  Completed   Hepatitis C Screening  Completed   HPV VACCINES  Aged Out   Colonoscopy  Discontinued    Health Maintenance  Health Maintenance Due  Topic Date Due   DEXA SCAN  04/30/2017   COVID-19 Vaccine (4 - 2023-24 season) 06/24/2022   INFLUENZA VACCINE  05/25/2023    Colorectal cancer screening: No  longer required.   Mammogram status: No longer required due to age.  Bone Density status: Ordered 05/30/2023. Pt provided with contact info and advised to call to schedule appt.  Lung Cancer Screening: (Low Dose CT Chest recommended if Age 66-80 years, 20 pack-year currently smoking OR have quit w/in 15years.) does not qualify.   Lung Cancer Screening Referral: n/a  Additional Screening:  Hepatitis C Screening: does not qualify;   Vision Screening: Recommended annual ophthalmology exams for early detection of glaucoma and other disorders of the eye. Is the patient up to date with their annual eye exam?  Yes  Who is the provider or what is the name of the office in which the patient attends annual eye exams? Dr.johnson  If pt is not established with a provider, would they like to be referred to a provider to establish care? No .   Dental Screening: Recommended annual dental exams for proper oral hygiene   Community Resource Referral / Chronic Care Management: CRR required this visit?  No   CCM required this visit?  No     Plan:     I have personally reviewed and noted the following in the patient's chart:   Medical and social history Use of alcohol, tobacco or illicit drugs  Current medications and supplements including opioid prescriptions. Patient is not currently taking opioid prescriptions. Functional ability and status Nutritional status Physical activity Advanced directives List of other physicians Hospitalizations, surgeries, and ER visits in previous 12 months Vitals Screenings to include cognitive, depression, and falls Referrals and appointments  In addition, I have reviewed and discussed with patient certain preventive protocols, quality metrics, and best practice recommendations. A written personalized care plan for preventive services as well as general preventive health recommendations were provided to patient.     Lorrene Reid, LPN   2/70/3500    After Visit Summary: (MyChart) Due to this being a telephonic visit, the after visit summary with patients personalized plan was offered to patient via MyChart   Nurse Notes: none

## 2023-08-06 ENCOUNTER — Other Ambulatory Visit: Payer: Self-pay | Admitting: Nurse Practitioner

## 2023-08-06 DIAGNOSIS — R600 Localized edema: Secondary | ICD-10-CM

## 2023-09-27 ENCOUNTER — Other Ambulatory Visit: Payer: Self-pay | Admitting: Nurse Practitioner

## 2023-09-27 DIAGNOSIS — Z1231 Encounter for screening mammogram for malignant neoplasm of breast: Secondary | ICD-10-CM

## 2023-10-02 ENCOUNTER — Ambulatory Visit
Admission: RE | Admit: 2023-10-02 | Discharge: 2023-10-02 | Disposition: A | Discharge status: Home / Self Care | Payer: Medicare HMO | Source: Ambulatory Visit | Attending: Nurse Practitioner | Admitting: Nurse Practitioner

## 2023-10-02 DIAGNOSIS — Z1231 Encounter for screening mammogram for malignant neoplasm of breast: Secondary | ICD-10-CM | POA: Diagnosis not present

## 2023-10-23 DIAGNOSIS — R531 Weakness: Secondary | ICD-10-CM | POA: Diagnosis not present

## 2023-10-23 DIAGNOSIS — Z1152 Encounter for screening for COVID-19: Secondary | ICD-10-CM | POA: Diagnosis not present

## 2023-10-23 DIAGNOSIS — I10 Essential (primary) hypertension: Secondary | ICD-10-CM | POA: Diagnosis not present

## 2023-10-23 DIAGNOSIS — R509 Fever, unspecified: Secondary | ICD-10-CM | POA: Diagnosis not present

## 2023-10-23 DIAGNOSIS — J101 Influenza due to other identified influenza virus with other respiratory manifestations: Secondary | ICD-10-CM | POA: Diagnosis not present

## 2023-10-23 DIAGNOSIS — R059 Cough, unspecified: Secondary | ICD-10-CM | POA: Diagnosis not present

## 2023-10-23 DIAGNOSIS — R110 Nausea: Secondary | ICD-10-CM | POA: Diagnosis not present

## 2023-10-29 DIAGNOSIS — N179 Acute kidney failure, unspecified: Secondary | ICD-10-CM | POA: Diagnosis not present

## 2023-10-29 DIAGNOSIS — R112 Nausea with vomiting, unspecified: Secondary | ICD-10-CM | POA: Diagnosis not present

## 2023-10-29 DIAGNOSIS — R197 Diarrhea, unspecified: Secondary | ICD-10-CM | POA: Diagnosis not present

## 2023-10-29 DIAGNOSIS — I10 Essential (primary) hypertension: Secondary | ICD-10-CM | POA: Diagnosis not present

## 2023-10-29 DIAGNOSIS — N390 Urinary tract infection, site not specified: Secondary | ICD-10-CM | POA: Diagnosis not present

## 2023-11-13 ENCOUNTER — Ambulatory Visit (INDEPENDENT_AMBULATORY_CARE_PROVIDER_SITE_OTHER): Payer: Medicare HMO

## 2023-11-13 ENCOUNTER — Encounter: Payer: Self-pay | Admitting: Nurse Practitioner

## 2023-11-13 ENCOUNTER — Ambulatory Visit (INDEPENDENT_AMBULATORY_CARE_PROVIDER_SITE_OTHER): Payer: Medicare HMO | Admitting: Nurse Practitioner

## 2023-11-13 VITALS — BP 143/76 | HR 84 | Temp 97.3°F | Ht 65.0 in | Wt 174.0 lb

## 2023-11-13 DIAGNOSIS — Z780 Asymptomatic menopausal state: Secondary | ICD-10-CM | POA: Diagnosis not present

## 2023-11-13 DIAGNOSIS — R600 Localized edema: Secondary | ICD-10-CM | POA: Diagnosis not present

## 2023-11-13 DIAGNOSIS — I10 Essential (primary) hypertension: Secondary | ICD-10-CM | POA: Diagnosis not present

## 2023-11-13 DIAGNOSIS — E782 Mixed hyperlipidemia: Secondary | ICD-10-CM | POA: Diagnosis not present

## 2023-11-13 DIAGNOSIS — Z6829 Body mass index (BMI) 29.0-29.9, adult: Secondary | ICD-10-CM | POA: Diagnosis not present

## 2023-11-13 LAB — LIPID PANEL

## 2023-11-13 LAB — CBC WITH DIFFERENTIAL/PLATELET

## 2023-11-13 MED ORDER — LISINOPRIL-HYDROCHLOROTHIAZIDE 20-12.5 MG PO TABS: 1.0000 | ORAL_TABLET | Freq: Every day | ORAL | 1 refills | Status: DC

## 2023-11-13 NOTE — Patient Instructions (Signed)

## 2023-11-13 NOTE — Progress Notes (Signed)
Subjective:    Patient ID: Ariana Dillon, female    DOB: 1946/01/08, 78 y.o.   MRN: [id number]   Chief Complaint: medical management of chronic issues     HPI:  Ariana Dillon is a 77 y.o. who identifies as a female who was assigned female at birth.   Social history: Lives with: husband Work history: retired   Water engineer in today for follow up of the following chronic medical issues:  1. ESSENTIAL HYPERTENSION, BENIGN No c/o chest pain, sob or headache. Does not check blood pressure at home. We changed patient to lasix at last visit but she says that made her feel bad so she went back on lisinopril/hydrochlorothiazide and is doing better BP Readings from Last 3 Encounters:  05/12/23 133/77  04/26/23 (!) 159/78  04/06/23 (!) 153/79     2. MIXED HYPERLIPIDEMIA Does watch diet but doe snot do much exercise. Refuses statin therapy. Lab Results  Component Value Date   CHOL 190 05/12/2023   HDL 50 05/12/2023   LDLCALC 121 (H) 05/12/2023   TRIG 104 05/12/2023   CHOLHDL 3.8 05/12/2023   The 10-year ASCVD risk score (Arnett DK, et al., 2019) is: 45%   3. BMI 29.0-29.9,adult No recent weight changes  Wt Readings from Last 3 Encounters:  11/13/23 174 lb (78.9 kg)  06/21/23 175 lb (79.4 kg)  05/12/23 175 lb (79.4 kg)   BMI Readings from Last 3 Encounters:  11/13/23 28.96 kg/m  06/21/23 29.12 kg/m  05/12/23 29.12 kg/m      New complaints: None today  Allergies  Allergen Reactions   Lipitor [Atorvastatin] Other (See Comments)    Myalgia    Penicillins    Sulfa Antibiotics     Itching    Outpatient Encounter Medications as of 11/13/2023  Medication Sig   furosemide (LASIX) 20 MG tablet TAKE 1 TABLET BY MOUTH EVERY DAY   ibuprofen (ADVIL) 600 MG tablet Take 1 tablet (600 mg total) by mouth every 8 (eight) hours as needed.   lisinopril (ZESTRIL) 20 MG tablet Take 1 tablet (20 mg total) by mouth daily.   Omega 3 1000 MG CAPS TAKE 2 CAPSULES (2,000 MG TOTAL) BY MOUTH  DAILY.   psyllium (METAMUCIL) 58.6 % packet Take 1 packet by mouth daily.   Facility-Administered Encounter Medications as of 11/13/2023  Medication   0.9 %  sodium chloride infusion    Past Surgical History:  Procedure Laterality Date   BREAST CYST ASPIRATION Right     Family History  Problem Relation Age of Onset   Diabetes Mother    Alzheimer's disease Mother    Cancer Father        lung   Polymyositis Sister    Heart disease Brother    Heart disease Brother    Heart disease Brother    Breast cancer Maternal Aunt    Colon cancer Neg Hx    Colon polyps Neg Hx    Esophageal cancer Neg Hx    Rectal cancer Neg Hx    Stomach cancer Neg Hx       Controlled substance contract: n/a      Review of Systems  Constitutional:  Negative for diaphoresis.  Eyes:  Negative for pain.  Respiratory:  Negative for shortness of breath.   Cardiovascular:  Negative for chest pain, palpitations and leg swelling.  Gastrointestinal:  Negative for abdominal pain.  Endocrine: Negative for polydipsia.  Skin:  Negative for rash.  Neurological:  Negative for dizziness, weakness and headaches.  Hematological:  Does not bruise/bleed easily.  All other systems reviewed and are negative.      Objective:   Physical Exam Vitals and nursing note reviewed.  Constitutional:      General: She is not in acute distress.    Appearance: Normal appearance. She is well-developed.  HENT:     Head: Normocephalic.     Right Ear: Tympanic membrane normal.     Left Ear: Tympanic membrane normal.     Nose: Nose normal.     Mouth/Throat:     Mouth: Mucous membranes are moist.  Eyes:     Pupils: Pupils are equal, round, and reactive to light.  Neck:     Vascular: No carotid bruit or JVD.  Cardiovascular:     Rate and Rhythm: Normal rate and regular rhythm.     Heart sounds: Murmur (2/6) heard.  Pulmonary:     Effort: Pulmonary effort is normal. No respiratory distress.     Breath sounds: Normal  breath sounds. No wheezing or rales.  Chest:     Chest wall: No tenderness.  Abdominal:     General: Bowel sounds are normal. There is no distension or abdominal bruit.     Palpations: Abdomen is soft. There is no hepatomegaly, splenomegaly, mass or pulsatile mass.     Tenderness: There is no abdominal tenderness.  Musculoskeletal:        General: Normal range of motion.     Cervical back: Normal range of motion and neck supple.     Right lower leg: Edema (1+) present.     Left lower leg: Edema (1+) present.  Lymphadenopathy:     Cervical: No cervical adenopathy.  Skin:    General: Skin is warm and dry.  Neurological:     Mental Status: She is alert and oriented to person, place, and time.     Deep Tendon Reflexes: Reflexes are normal and symmetric.  Psychiatric:        Behavior: Behavior normal.        Thought Content: Thought content normal.        Judgment: Judgment normal.    BP (!) 143/76   Pulse 84   Temp (!) 97.3 F (36.3 C) (Temporal)   Ht 5\' 5"  (1.651 m)   Wt 174 lb (78.9 kg)   SpO2 99%   BMI 28.96 kg/m          Assessment & Plan:   Ariana Dillon comes in today with chief complaint of Medical Management of Chronic Issues   Diagnosis and orders addressed:  1. ESSENTIAL HYPERTENSION, BENIGN (Primary) Low sodium diet - lisinopril-hydrochlorothiazide (ZESTORETIC) 20-12.5 MG tablet; Take 1 tablet by mouth daily.  Dispense: 90 tablet; Refill: 1 - CBC with Differential/Platelet - CMP14+EGFR  2. MIXED HYPERLIPIDEMIA Low fat diet - Lipid panel  3. BMI 29.0-29.9,adult Discussed diet and exercise for person with BMI >25 Will recheck weight in 3-6 months   4. Peripheral edema Compression socks Elevate legs when sitting   Labs pending Health Maintenance reviewed Diet and exercise encouraged  Follow up plan: 6 months   Ariana Daphine Deutscher, FNP

## 2023-11-14 LAB — CMP14+EGFR
ALT: 11 IU/L (ref 0–32)
AST: 14 IU/L (ref 0–40)
Albumin: 3.7 g/dL — ABNORMAL LOW (ref 3.8–4.8)
Alkaline Phosphatase: 83 IU/L (ref 44–121)
BUN/Creatinine Ratio: 19 (ref 12–28)
BUN: 26 mg/dL (ref 8–27)
Bilirubin Total: 0.3 mg/dL (ref 0.0–1.2)
CO2: 25 mmol/L (ref 20–29)
Calcium: 9.2 mg/dL (ref 8.7–10.3)
Chloride: 103 mmol/L (ref 96–106)
Creatinine, Ser: 1.39 mg/dL — ABNORMAL HIGH (ref 0.57–1.00)
Globulin, Total: 2.8 g/dL (ref 1.5–4.5)
Glucose: 93 mg/dL (ref 70–99)
Potassium: 3.5 mmol/L (ref 3.5–5.2)
Sodium: 142 mmol/L (ref 134–144)
Total Protein: 6.5 g/dL (ref 6.0–8.5)
eGFR: 39 mL/min/{1.73_m2} — ABNORMAL LOW (ref >59)

## 2023-11-14 LAB — CBC WITH DIFFERENTIAL/PLATELET
Basos: 1 %
EOS (ABSOLUTE): 0.1 10*3/uL (ref 0.0–0.2)
Eos: 1 %
Hematocrit: 30.5 % — ABNORMAL LOW (ref 34.0–46.6)
Hemoglobin: 10.2 g/dL — ABNORMAL LOW (ref 11.1–15.9)
Immature Granulocytes: 0 %
Immature Granulocytes: 0 10*3/uL (ref 0.0–0.1)
Lymphs: 17 %
MCH: 29.2 pg (ref 26.6–33.0)
MCHC: 33.4 g/dL (ref 31.5–35.7)
MCV: 87 fL (ref 79–97)
Monocytes Absolute: 0.1 10*3/uL (ref 0.0–0.4)
Monocytes Absolute: 0.8 10*3/uL (ref 0.1–0.9)
Monocytes: 9 %
Neutrophils Absolute: 1.5 10*3/uL (ref 0.7–3.1)
Neutrophils Absolute: 6.1 10*3/uL (ref 1.4–7.0)
Neutrophils: 72 %
Platelets: 282 10*3/uL (ref 150–450)
RBC: 3.49 x10E6/uL — ABNORMAL LOW (ref 3.77–5.28)
RDW: 12.3 % (ref 11.7–15.4)
WBC: 8.5 10*3/uL (ref 3.4–10.8)

## 2023-11-14 LAB — LIPID PANEL
Cholesterol, Total: 182 mg/dL (ref 100–199)
HDL: 41 mg/dL (ref >39)
LDL CALC COMMENT:: 4.4 ratio (ref 0.0–4.4)
LDL Chol Calc (NIH): 119 mg/dL — ABNORMAL HIGH (ref 0–99)
Triglycerides: 119 mg/dL (ref 0–149)
VLDL Cholesterol Cal: 22 mg/dL (ref 5–40)

## 2023-11-15 ENCOUNTER — Telehealth: Payer: Self-pay

## 2023-11-15 DIAGNOSIS — Z780 Asymptomatic menopausal state: Secondary | ICD-10-CM | POA: Diagnosis not present

## 2023-11-15 DIAGNOSIS — M8589 Other specified disorders of bone density and structure, multiple sites: Secondary | ICD-10-CM | POA: Diagnosis not present

## 2023-11-15 NOTE — Telephone Encounter (Signed)
Copied from CRM [id number]. Topic: Clinical - Medical Advice >> Nov 15, 2023 12:51 PM Ariana Dillon wrote: Reason for CRM: pt would like  Daphine Deutscher, Mary-Margaret, to call her back.  She said she should know what it is concerning

## 2023-11-27 ENCOUNTER — Telehealth: Payer: Self-pay | Admitting: Family Medicine

## 2023-11-27 NOTE — Telephone Encounter (Signed)
Referral was done on 02/2023. Did she not get a call from office?

## 2023-11-27 NOTE — Telephone Encounter (Signed)
Copied from CRM [id number]. Topic: Referral - Question >> Nov 27, 2023 10:48 AM Louie Casa B wrote: Reason for CRM: patient needs referral faxed to [phone] gynecologist

## 2023-11-27 NOTE — Telephone Encounter (Signed)
Left message to contact office

## 2023-11-28 ENCOUNTER — Telehealth: Payer: Self-pay | Admitting: Nurse Practitioner

## 2023-11-28 ENCOUNTER — Other Ambulatory Visit: Payer: Self-pay

## 2023-11-28 DIAGNOSIS — N8110 Cystocele, unspecified: Secondary | ICD-10-CM

## 2023-11-28 NOTE — Telephone Encounter (Signed)
Called and spoke with patient and she has contacted Dr Jacinto Halim in [facility] and they just need a referral from [place] for vaginal prolapse. Referral made

## 2023-11-28 NOTE — Telephone Encounter (Signed)
 See other telephone encounter.

## 2023-11-28 NOTE — Telephone Encounter (Signed)
Copied from CRM [id number]. Topic: Referral - Question >> Nov 27, 2023  4:47 PM Shelah Lewandowsky wrote: Reason for CRM: Message for [facility], not the same doctor as before, different doctor.

## 2024-01-03 DIAGNOSIS — Z1331 Encounter for screening for depression: Secondary | ICD-10-CM | POA: Diagnosis not present

## 2024-01-03 DIAGNOSIS — Z01411 Encounter for gynecological examination (general) (routine) with abnormal findings: Secondary | ICD-10-CM | POA: Diagnosis not present

## 2024-01-03 DIAGNOSIS — Z0001 Encounter for general adult medical examination with abnormal findings: Secondary | ICD-10-CM | POA: Diagnosis not present

## 2024-01-03 DIAGNOSIS — Z01419 Encounter for gynecological examination (general) (routine) without abnormal findings: Secondary | ICD-10-CM | POA: Diagnosis not present

## 2024-01-03 DIAGNOSIS — N939 Abnormal uterine and vaginal bleeding, unspecified: Secondary | ICD-10-CM | POA: Diagnosis not present

## 2024-01-03 DIAGNOSIS — N813 Complete uterovaginal prolapse: Secondary | ICD-10-CM | POA: Diagnosis not present

## 2024-01-03 DIAGNOSIS — Z1389 Encounter for screening for other disorder: Secondary | ICD-10-CM | POA: Diagnosis not present

## 2024-01-18 DIAGNOSIS — N813 Complete uterovaginal prolapse: Secondary | ICD-10-CM | POA: Diagnosis not present

## 2024-05-05 ENCOUNTER — Other Ambulatory Visit: Payer: Self-pay | Admitting: Nurse Practitioner

## 2024-05-05 DIAGNOSIS — I10 Essential (primary) hypertension: Secondary | ICD-10-CM

## 2024-05-09 DIAGNOSIS — L820 Inflamed seborrheic keratosis: Secondary | ICD-10-CM | POA: Diagnosis not present

## 2024-05-09 DIAGNOSIS — L570 Actinic keratosis: Secondary | ICD-10-CM | POA: Diagnosis not present

## 2024-05-09 DIAGNOSIS — X32XXXD Exposure to sunlight, subsequent encounter: Secondary | ICD-10-CM | POA: Diagnosis not present

## 2024-05-10 ENCOUNTER — Encounter: Payer: Self-pay | Admitting: Nurse Practitioner

## 2024-05-10 ENCOUNTER — Ambulatory Visit (INDEPENDENT_AMBULATORY_CARE_PROVIDER_SITE_OTHER): Payer: Medicare HMO | Admitting: Nurse Practitioner

## 2024-05-10 VITALS — BP 131/74 | HR 97 | Temp 96.3°F | Ht 65.0 in | Wt 173.0 lb

## 2024-05-10 DIAGNOSIS — I10 Essential (primary) hypertension: Secondary | ICD-10-CM | POA: Diagnosis not present

## 2024-05-10 DIAGNOSIS — Z6829 Body mass index (BMI) 29.0-29.9, adult: Secondary | ICD-10-CM

## 2024-05-10 DIAGNOSIS — E782 Mixed hyperlipidemia: Secondary | ICD-10-CM | POA: Diagnosis not present

## 2024-05-10 DIAGNOSIS — Z0001 Encounter for general adult medical examination with abnormal findings: Secondary | ICD-10-CM

## 2024-05-10 DIAGNOSIS — R600 Localized edema: Secondary | ICD-10-CM | POA: Diagnosis not present

## 2024-05-10 DIAGNOSIS — Z0000 Encounter for general adult medical examination without abnormal findings: Secondary | ICD-10-CM

## 2024-05-10 MED ORDER — LISINOPRIL-HYDROCHLOROTHIAZIDE 20-12.5 MG PO TABS: 1.0000 | ORAL_TABLET | Freq: Every day | ORAL | 1 refills | Status: DC

## 2024-05-10 MED ORDER — FERROUS SULFATE 325 (65 FE) MG PO TBEC: 325.0000 mg | DELAYED_RELEASE_TABLET | Freq: Every day | ORAL | 1 refills | Status: DC

## 2024-05-10 NOTE — Addendum Note (Signed)
 Addended by: Naiah Donahoe, MARY-MARGARET on: 05/10/2024 03:03 PM   Modules accepted: Orders

## 2024-05-10 NOTE — Patient Instructions (Signed)

## 2024-05-10 NOTE — Progress Notes (Signed)
 Subjective:    Patient ID: Ariana Dillon, female    DOB: 03-18-1946, 78 y.o.   MRN: [id number]   Chief Complaint: annual physical    HPI:  Ariana Dillon is a 78 y.o. who identifies as a female who was assigned female at birth.   Social history: Lives with: husband Work history: retired   Water engineer in today for follow up of the following chronic medical issues:  1. ESSENTIAL HYPERTENSION, BENIGN No c/o chest pain, sob or headache. Does not check blood pressure at home. We changed patient to lasix  at last visit but she says that made her feel bad so she went back on lisinopril /hydrochlorothiazide  and is doing better BP Readings from Last 3 Encounters:  11/13/23 (!) 143/76  05/12/23 133/77  04/26/23 (!) 159/78     2. MIXED HYPERLIPIDEMIA Does watch diet but doe snot do much exercise. Refuses statin therapy. Lab Results  Component Value Date   CHOL 182 11/13/2023   HDL 41 11/13/2023   LDLCALC 119 (H) 11/13/2023   TRIG 119 11/13/2023   CHOLHDL 4.4 11/13/2023   The 10-year ASCVD risk score (Arnett DK, et al., 2019) is: 30.2%   3. BMI 29.0-29.9,adult No recent weight changes Wt Readings from Last 3 Encounters:  05/10/24 173 lb (78.5 kg)  11/13/23 174 lb (78.9 kg)  06/21/23 175 lb (79.4 kg)   BMI Readings from Last 3 Encounters:  05/10/24 28.79 kg/m  11/13/23 28.96 kg/m  06/21/23 29.12 kg/m         New complaints: None today  Allergies  Allergen Reactions   Lipitor [Atorvastatin] Other (See Comments)    Myalgia    Penicillins    Sulfa Antibiotics     Itching    Outpatient Encounter Medications as of 05/10/2024  Medication Sig   ibuprofen  (ADVIL ) 600 MG tablet Take 1 tablet (600 mg total) by mouth every 8 (eight) hours as needed.   lisinopril -hydrochlorothiazide  (ZESTORETIC ) 20-12.5 MG tablet TAKE 1 TABLET BY MOUTH EVERY DAY   Omega 3 1000 MG CAPS TAKE 2 CAPSULES (2,000 MG TOTAL) BY MOUTH DAILY.   psyllium (METAMUCIL) 58.6 % packet Take 1 packet by mouth  daily.   Facility-Administered Encounter Medications as of 05/10/2024  Medication   0.9 %  sodium chloride  infusion    Past Surgical History:  Procedure Laterality Date   BREAST CYST ASPIRATION Right     Family History  Problem Relation Age of Onset   Diabetes Mother    Alzheimer's disease Mother    Cancer Father        lung   Polymyositis Sister    Heart disease Brother    Heart disease Brother    Heart disease Brother    Breast cancer Maternal Aunt    Colon cancer Neg Hx    Colon polyps Neg Hx    Esophageal cancer Neg Hx    Rectal cancer Neg Hx    Stomach cancer Neg Hx       Controlled substance contract: n/a      Review of Systems  Constitutional:  Negative for diaphoresis.  Eyes:  Negative for pain.  Respiratory:  Negative for shortness of breath.   Cardiovascular:  Negative for chest pain, palpitations and leg swelling.  Gastrointestinal:  Negative for abdominal pain.  Endocrine: Negative for polydipsia.  Skin:  Negative for rash.  Neurological:  Negative for dizziness, weakness and headaches.  Hematological:  Does not bruise/bleed easily.  All other systems reviewed and are negative.  Objective:   Physical Exam Vitals and nursing note reviewed.  Constitutional:      General: She is not in acute distress.    Appearance: Normal appearance. She is well-developed.  HENT:     Head: Normocephalic.     Right Ear: Tympanic membrane normal.     Left Ear: Tympanic membrane normal.     Nose: Nose normal.     Mouth/Throat:     Mouth: Mucous membranes are moist.  Eyes:     Pupils: Pupils are equal, round, and reactive to light.  Neck:     Vascular: No carotid bruit or JVD.  Cardiovascular:     Rate and Rhythm: Normal rate and regular rhythm.     Heart sounds: Murmur (2/6) heard.  Pulmonary:     Effort: Pulmonary effort is normal. No respiratory distress.     Breath sounds: Normal breath sounds. No wheezing or rales.  Chest:     Chest wall: No  tenderness.  Abdominal:     General: Bowel sounds are normal. There is no distension or abdominal bruit.     Palpations: Abdomen is soft. There is no hepatomegaly, splenomegaly, mass or pulsatile mass.     Tenderness: There is no abdominal tenderness.  Musculoskeletal:        General: Normal range of motion.     Cervical back: Normal range of motion and neck supple.     Right lower leg: Edema (1+) present.     Left lower leg: Edema (1+) present.  Lymphadenopathy:     Cervical: No cervical adenopathy.  Skin:    General: Skin is warm and dry.  Neurological:     Mental Status: She is alert and oriented to person, place, and time.     Deep Tendon Reflexes: Reflexes are normal and symmetric.  Psychiatric:        Behavior: Behavior normal.        Thought Content: Thought content normal.        Judgment: Judgment normal.    BP 131/74   Pulse 97   Temp (!) 96.3 F (35.7 C) (Temporal)   Ht 5' 5 (1.651 m)   Wt 173 lb (78.5 kg)   SpO2 95%   BMI 28.79 kg/m           Assessment & Plan:   Ariana Dillon comes in today with chief complaint of annual physical  Diagnosis and orders addressed:  1. ESSENTIAL HYPERTENSION, BENIGN (Primary) Low sodium diet - lisinopril -hydrochlorothiazide  (ZESTORETIC ) 20-12.5 MG tablet; Take 1 tablet by mouth daily.  Dispense: 90 tablet; Refill: 1 - CBC with Differential/Platelet - CMP14+EGFR  2. MIXED HYPERLIPIDEMIA Low fat diet - Lipid panel  3. BMI 29.0-29.9,adult Discussed diet and exercise for person with BMI >25 Will recheck weight in 3-6 months   4. Peripheral edema Compression socks Elevate legs when sitting   Labs pending Health Maintenance reviewed Diet and exercise encouraged  Follow up plan: 6 months   Ariana-Margaret Gladis, FNP

## 2024-05-13 LAB — CBC WITH DIFFERENTIAL/PLATELET
Basophils Absolute: 0.1 x10E3/uL (ref 0.0–0.2)
Basos: 1 %
EOS (ABSOLUTE): 0.3 x10E3/uL (ref 0.0–0.4)
Eos: 3 %
Hematocrit: 30.9 % — ABNORMAL LOW (ref 34.0–46.6)
Hemoglobin: 10.1 g/dL — ABNORMAL LOW (ref 11.1–15.9)
Immature Grans (Abs): 0.1 x10E3/uL (ref 0.0–0.1)
Immature Granulocytes: 1 %
Lymphocytes Absolute: 1.6 x10E3/uL (ref 0.7–3.1)
Lymphs: 15 %
MCH: 29.6 pg (ref 26.6–33.0)
MCHC: 32.7 g/dL (ref 31.5–35.7)
MCV: 91 fL (ref 79–97)
Monocytes Absolute: 0.8 x10E3/uL (ref 0.1–0.9)
Monocytes: 8 %
Neutrophils Absolute: 7.5 x10E3/uL — ABNORMAL HIGH (ref 1.4–7.0)
Neutrophils: 72 %
Platelets: 229 x10E3/uL (ref 150–450)
RBC: 3.41 x10E6/uL — ABNORMAL LOW (ref 3.77–5.28)
RDW: 13 % (ref 11.7–15.4)
WBC: 10.4 x10E3/uL (ref 3.4–10.8)

## 2024-05-13 LAB — THYROID PANEL WITH TSH
Free Thyroxine Index: 1.9 (ref 1.2–4.9)
T3 Uptake Ratio: 25 % (ref 24–39)
T4, Total: 7.4 ug/dL (ref 4.5–12.0)
TSH: 1.63 u[IU]/mL (ref 0.450–4.500)

## 2024-05-13 LAB — VITAMIN D 25 HYDROXY (VIT D DEFICIENCY, FRACTURES): Vit D, 25-Hydroxy: 29.7 ng/mL — ABNORMAL LOW (ref 30.0–100.0)

## 2024-05-13 LAB — CMP14+EGFR
ALT: 7 IU/L (ref 0–32)
AST: 11 IU/L (ref 0–40)
Albumin: 4.2 g/dL (ref 3.8–4.8)
Alkaline Phosphatase: 85 IU/L (ref 44–121)
BUN/Creatinine Ratio: 23 (ref 12–28)
BUN: 50 mg/dL — ABNORMAL HIGH (ref 8–27)
Bilirubin Total: <0.2 mg/dL (ref 0.0–1.2)
CO2: 19 mmol/L — ABNORMAL LOW (ref 20–29)
Calcium: 10 mg/dL (ref 8.7–10.3)
Chloride: 104 mmol/L (ref 96–106)
Creatinine, Ser: 2.22 mg/dL — ABNORMAL HIGH (ref 0.57–1.00)
Globulin, Total: 2.8 g/dL (ref 1.5–4.5)
Glucose: 124 mg/dL — ABNORMAL HIGH (ref 70–99)
Potassium: 4.7 mmol/L (ref 3.5–5.2)
Sodium: 140 mmol/L (ref 134–144)
Total Protein: 7 g/dL (ref 6.0–8.5)
eGFR: 22 mL/min/1.73 — ABNORMAL LOW (ref >59)

## 2024-05-13 LAB — LIPID PANEL
Chol/HDL Ratio: 4.6 ratio — ABNORMAL HIGH (ref 0.0–4.4)
Cholesterol, Total: 185 mg/dL (ref 100–199)
HDL: 40 mg/dL (ref >39)
LDL Chol Calc (NIH): 119 mg/dL — ABNORMAL HIGH (ref 0–99)
Triglycerides: 143 mg/dL (ref 0–149)
VLDL Cholesterol Cal: 26 mg/dL (ref 5–40)

## 2024-05-17 ENCOUNTER — Ambulatory Visit: Admitting: Obstetrics and Gynecology

## 2024-05-17 ENCOUNTER — Encounter: Payer: Self-pay | Admitting: Obstetrics and Gynecology

## 2024-05-17 ENCOUNTER — Other Ambulatory Visit (HOSPITAL_COMMUNITY)
Admission: RE | Admit: 2024-05-17 | Discharge: 2024-05-17 | Disposition: A | Discharge status: Home / Self Care | Source: Other Acute Inpatient Hospital | Attending: Obstetrics and Gynecology | Admitting: Obstetrics and Gynecology

## 2024-05-17 VITALS — BP 156/68 | HR 99 | Ht 63.3 in | Wt 171.4 lb

## 2024-05-17 DIAGNOSIS — R350 Frequency of micturition: Secondary | ICD-10-CM | POA: Diagnosis not present

## 2024-05-17 DIAGNOSIS — N3281 Overactive bladder: Secondary | ICD-10-CM

## 2024-05-17 DIAGNOSIS — N393 Stress incontinence (female) (male): Secondary | ICD-10-CM

## 2024-05-17 DIAGNOSIS — R319 Hematuria, unspecified: Secondary | ICD-10-CM

## 2024-05-17 DIAGNOSIS — N952 Postmenopausal atrophic vaginitis: Secondary | ICD-10-CM

## 2024-05-17 DIAGNOSIS — N813 Complete uterovaginal prolapse: Secondary | ICD-10-CM | POA: Diagnosis not present

## 2024-05-17 DIAGNOSIS — R82998 Other abnormal findings in urine: Secondary | ICD-10-CM | POA: Diagnosis not present

## 2024-05-17 LAB — POCT URINALYSIS DIP (CLINITEK)
Bilirubin, UA: NEGATIVE
Bilirubin, UA: NEGATIVE
Glucose, UA: NEGATIVE mg/dL
Glucose, UA: NEGATIVE mg/dL
Ketones, POC UA: NEGATIVE mg/dL
Ketones, POC UA: NEGATIVE mg/dL
Nitrite, UA: POSITIVE — AB
Nitrite, UA: NEGATIVE
POC PROTEIN,UA: NEGATIVE
Spec Grav, UA: 1.015 (ref 1.010–1.025)
Spec Grav, UA: 1.015 (ref 1.010–1.025)
Urobilinogen, UA: 0.2 U/dL
Urobilinogen, UA: 0.2 U/dL
pH, UA: 5.5 (ref 5.0–8.0)
pH, UA: 6 (ref 5.0–8.0)

## 2024-05-17 LAB — URINALYSIS, ROUTINE W REFLEX MICROSCOPIC
Bilirubin Urine: NEGATIVE
Glucose, UA: NEGATIVE mg/dL
Ketones, ur: NEGATIVE mg/dL
Nitrite: POSITIVE — AB
Protein, ur: 30 mg/dL — AB
Specific Gravity, Urine: 1.009 (ref 1.005–1.030)
WBC, UA: >50 WBC/hpf (ref 0–5)
pH: 6 (ref 5.0–8.0)

## 2024-05-17 MED ORDER — ESTRADIOL 0.1 MG/GM VA CREA: TOPICAL_CREAM | VAGINAL | 11 refills | Status: AC

## 2024-05-17 NOTE — Assessment & Plan Note (Signed)
-   Will reassess symptoms next visit.  - If she wants to pursue surgery, then will need urodynamic testing.

## 2024-05-17 NOTE — Progress Notes (Signed)
 New Patient Evaluation and Consultation  Referring Provider: Glena Collard, * PCP: Gladis Mustard, FNP Date of Service: 05/17/2024  SUBJECTIVE Chief Complaint: New Patient (Initial Visit) Lashandra Arauz is a 78 y.o. female is here for prolapse)  History of Present Illness: Day Greb is a 78 y.o. White or Caucasian female seen in consultation at the request of Dr Glena for evaluation of prolapse.    Review of records significant for: Complete procidentia noted on exam  Urinary Symptoms: Leaks urine with cough/ sneeze, laughing, exercise, lifting, with a full bladder, and with urgency Leaks a few times a day- a small amount. In the morning, has a lot more urgency.  Pad use:  adult diapers  Patient is bothered by UI symptoms. Has not had prior treatment for incontinence.   Day time voids- every hour.  Nocturia: 1 times per night to void. Voiding dysfunction:  does not empty bladder well.  Patient does not use a catheter to empty bladder.  When urinating, patient feels a weak stream and difficulty starting urine stream Drinks: 24oz water, 2 glasses sweet tea, 1 cup coffee per day  UTIs: 1 UTI's in the last year.   Denies history of blood in urine and kidney or bladder stones  Pelvic Organ Prolapse Symptoms:                  Patient Admits to a feeling of a bulge the vaginal area. It has been present for 3 years.  Patient Admits to seeing a bulge.  This bulge is bothersome. Had a pessary for several years up until last year and has progressively gotten worse. Had a ring with support pessary and did not try others.   Bowel Symptom: Bowel movements: 1 time(s) per day Stool consistency: loose Straining: no.  Splinting: no.  Incomplete evacuation: yes.  Patient Admits to accidental bowel leakage / fecal incontinence  Occurs: occasionally- every other day, small amout  Consistency with leakage: liquid Bowel regimen: fiber and stool softener Last  colonoscopy:  [facility] Colonoscopy          Completed or No Longer Recommended     Colonoscopy  Discontinued      Frequency changed to Never automatically (Topic No Longer Applies)   02/24/2023  COLONOSCOPY   Only the first 1 history entries have been loaded, but more history exists.                Sexual Function Sexually active: no.    Pelvic Pain Denies pelvic pain   Past Medical History:  Past Medical History:  Diagnosis Date   Anemia    Colon polyps    Diverticulosis    Hypertension      Past Surgical History:   Past Surgical History:  Procedure Laterality Date   BREAST CYST ASPIRATION Right      Past OB/GYN History: OB History  Gravida Para Term Preterm AB Living  1 1 1   1   SAB IAB Ectopic Multiple Live Births          # Outcome Date GA Lbr Len/2nd Weight Sex Type Anes PTL Lv  1 Term      Vag-Spont      No significant tears with delivery Menopausal: Denies vaginal bleeding since menopause Last pap smear was March 2025  Medications: Patient has a current medication list which includes the following prescription(s): [START ON 05/20/2024] estradiol, ferrous sulfate , ibuprofen , lisinopril -hydrochlorothiazide , and omega 3, and the following Facility-Administered Medications: sodium chloride .   Allergies: Patient  is allergic to lipitor [atorvastatin], penicillins, and sulfa antibiotics.   Social History:  Social History   Tobacco Use   Smoking status: Never   Smokeless tobacco: Never  Vaping Use   Vaping status: Never Used  Substance Use Topics   Alcohol  use: No   Drug use: No    Relationship status: widowed Patient lives alone Patient is not employed. Regular exercise: Yes: mowing, yard cleaning History of abuse: No  Family History:   Family History  Problem Relation Age of Onset   Diabetes Mother    Alzheimer's disease Mother    Cancer Father        lung   Polymyositis Sister    Heart disease Brother    Heart disease Brother     Heart disease Brother    Breast cancer Maternal Aunt    Colon cancer Neg Hx    Colon polyps Neg Hx    Esophageal cancer Neg Hx    Rectal cancer Neg Hx    Stomach cancer Neg Hx      Review of Systems: Review of Systems  Constitutional:  Positive for malaise/fatigue. Negative for fever and weight loss.  Respiratory:  Negative for cough, shortness of breath and wheezing.   Cardiovascular:  Positive for leg swelling. Negative for chest pain and palpitations.  Gastrointestinal:  Positive for blood in stool. Negative for abdominal pain.  Genitourinary:  Negative for dysuria.  Musculoskeletal:  Negative for myalgias.  Skin:  Negative for rash.  Neurological:  Positive for dizziness. Negative for headaches.  Endo/Heme/Allergies:  Does not bruise/bleed easily.  Psychiatric/Behavioral:  Negative for depression. The patient is not nervous/anxious.      OBJECTIVE Physical Exam: Vitals:   05/17/24 1309  BP: (!) 156/68  Pulse: 99  Weight: 171 lb 6.4 oz (77.7 kg)  Height: 5' 3.3 (1.608 m)    Physical Exam Vitals reviewed. Exam conducted with a chaperone present.  Constitutional:      General: She is not in acute distress. Pulmonary:     Effort: Pulmonary effort is normal.  Abdominal:     General: There is no distension.     Palpations: Abdomen is soft.     Tenderness: There is no abdominal tenderness. There is no rebound.  Musculoskeletal:        General: No swelling. Normal range of motion.  Skin:    General: Skin is warm and dry.     Findings: No rash.  Neurological:     Mental Status: She is alert and oriented to person, place, and time.  Psychiatric:        Mood and Affect: Mood normal.        Behavior: Behavior normal.      GU / Detailed Urogynecologic Evaluation:  Pelvic Exam: Normal external female genitalia; Bartholin's and Skene's glands normal in appearance; urethral meatus normal in appearance, no urethral masses or discharge.   CST: negative  Complete  procidentia noted. Speculum exam reveals normal vaginal mucosa with atrophy. Cervix normal appearance. Uterus normal single, nontender. Adnexa no mass, fullness, tenderness.    Pelvic floor strength II/V Pelvic floor musculature: Right levator non-tender, Right obturator non-tender, Left levator non-tender, Left obturator non-tender  POP-Q:   POP-Q  3                                            Aa  9                                           Ba  8                                              C   8                                            Gh  3.5                                            Pb  7                                            tvl   3                                            Ap  8                                            Bp  -2                                              D    A #6 (3in) gellhorn was placed. It was comfortable, fit well, and stayed in placed with strong cough, valsalva and bending and voiding.   Rectal Exam:  Normal anus  Post-Void Residual (PVR) by Bladder Scan: In order to evaluate bladder emptying, we discussed obtaining a postvoid residual and patient agreed to this procedure.  Procedure: The ultrasound unit was placed on the patient's abdomen in the suprapubic region after the patient had voided.    Post Void Residual - 05/17/24 1323       Post Void Residual   Post Void Residual 131 mL           Laboratory Results: Lab Results  Component Value Date   COLORU yellow 05/17/2024   CLARITYU cloudy (A) 05/17/2024   GLUCOSEUR negative 05/17/2024   BILIRUBINUR negative 05/17/2024   KETONESU neg 10/27/2014   SPECGRAV 1.015 05/17/2024   RBCUR moderate (A) 05/17/2024   PHUR 6.0 05/17/2024   PROTEINUR neg 10/27/2014   UROBILINOGEN 0.2 05/17/2024   LEUKOCYTESUR Large (3+) (A) 05/17/2024    Lab Results  Component Value Date   CREATININE 2.22 (H) 05/10/2024   CREATININE 1.39 (H) 11/13/2023   CREATININE 1.35 (H) 05/12/2023     No results found for: HGBA1C  Lab Results  Component Value Date   HGB 10.1 (L)  05/10/2024     ASSESSMENT AND PLAN Ms. Marschall is a 78 y.o. with:  1. Uterovaginal prolapse, complete   2. Vaginal atrophy   3. Overactive bladder   4. SUI (stress urinary incontinence, female)   5. Urinary frequency   6. Leukocytes in urine   7. Hematuria, unspecified type     Uterovaginal prolapse, complete Assessment & Plan: - Stage IV anterior, Stage IV posterior, Stage IV apical prolapse - For treatment of pelvic organ prolapse, we discussed options for management including expectant management, conservative management, and surgical management, such as Kegels, a pessary, pelvic floor physical therapy, and specific surgical procedures. - #6 (3in) LS gellhorn placed today. She prefers to keep a pessary if possible. Will have her return for a pessary check in a month and if it is going well, then can continue cleanings q3 months.  - We also discussed the surgical option of a LeForte Colpocleisis since she does not desire to be sexually active. Handout provided for her to review.    Vaginal atrophy -     Estradiol; Place 0.5g nightly for two weeks then twice a week after  Dispense: 30 g; Refill: 11  Overactive bladder Assessment & Plan: - We discussed the symptoms of overactive bladder (OAB), which include urinary urgency, urinary frequency, nocturia, with or without urge incontinence.  While we do not know the exact etiology of OAB, several treatment options exist. We discussed management including behavioral therapy (decreasing bladder irritants, urge suppression strategies, timed voids, bladder retraining), physical therapy, medication.  - Pt feels that her bladder symptoms are mostly due to incomplete emptying due to prolapse. So we can reassess symptoms at next visit now that the pessary is in place.  - Advised her to also limit caffeine and other irritative beverages.    SUI (stress urinary  incontinence, female) Assessment & Plan: - Will reassess symptoms next visit.  - If she wants to pursue surgery, then will need urodynamic testing.    Urinary frequency -     POCT URINALYSIS DIP (CLINITEK) -     POCT URINALYSIS DIP (CLINITEK)  Leukocytes in urine -     Urine Culture; Future  Hematuria, unspecified type -     Urine Microscopic; Future     Rosaline LOISE Caper, MD

## 2024-05-17 NOTE — Assessment & Plan Note (Addendum)
-   Stage IV anterior, Stage IV posterior, Stage IV apical prolapse - For treatment of pelvic organ prolapse, we discussed options for management including expectant management, conservative management, and surgical management, such as Kegels, a pessary, pelvic floor physical therapy, and specific surgical procedures. - #6 (3in) LS gellhorn placed today. She prefers to keep a pessary if possible. Will have her return for a pessary check in a month and if it is going well, then can continue cleanings q3 months.  - We also discussed the surgical option of a LeForte Colpocleisis since she does not desire to be sexually active. Handout provided for her to review.

## 2024-05-17 NOTE — Assessment & Plan Note (Addendum)
-   We discussed the symptoms of overactive bladder (OAB), which include urinary urgency, urinary frequency, nocturia, with or without urge incontinence.  While we do not know the exact etiology of OAB, several treatment options exist. We discussed management including behavioral therapy (decreasing bladder irritants, urge suppression strategies, timed voids, bladder retraining), physical therapy, medication.  - Pt feels that her bladder symptoms are mostly due to incomplete emptying due to prolapse. So we can reassess symptoms at next visit now that the pessary is in place.  - Advised her to also limit caffeine and other irritative beverages.

## 2024-05-17 NOTE — Patient Instructions (Addendum)
Today we talked about ways to manage bladder urgency such as altering your diet to avoid irritative beverages and foods (bladder diet) as well as attempting to decrease stress and other exacerbating factors.   The Most Bothersome Foods* The Least Bothersome Foods*  Coffee - Regular & Decaf Tea - caffeinated Carbonated beverages - cola, non-colas, diet & caffeine-free Alcohols - Beer, Red Wine, White Wine, Champagne Fruits - Grapefruit, Kelliher, Orange, [employer] - Cranberry, Grapefruit, Orange, Pineapple Vegetables - Tomato & Tomato Products Flavor Enhancers - Hot peppers, Spicy foods, Chili, Horseradish, Vinegar, Monosodium glutamate (MSG) Artificial Sweeteners - NutraSweet, Sweet 'N Low, Equal (sweetener), Saccharin Ethnic foods - [place], [place], [place] food Express Scripts - low-fat & whole Fruits - Bananas, Blueberries, Honeydew melon, Pears, Raisins, Watermelon Vegetables - Broccoli, Brussels Sprouts, Badger, Carrots, Cauliflower, [place], Cucumber, Mushrooms, Peas, Radishes, Squash, Zucchini, White potatoes, Sweet potatoes & yams Poultry - Chicken, Eggs, [place], [employer] - Beef, Programmer, multimedia, Lamb Seafood - Shrimp, [place] fish, Salmon Grains - Oat, Rice Snacks - Pretzels, Popcorn  *Lissa Morales et al. Diet and its role in interstitial cystitis/bladder pain syndrome (IC/BPS) and comorbid conditions. Ross 2012 Jan 11.     Start vaginal estrogen therapy nightly for two weeks then 2 times weekly at night for treatment of vaginal atrophy (dryness of the vaginal tissues).  Please let us know if the prescription is too expensive and we can look for alternative options.

## 2024-05-19 LAB — URINE CULTURE: Culture: >=100000 — AB

## 2024-05-20 ENCOUNTER — Ambulatory Visit: Payer: Self-pay | Admitting: Obstetrics and Gynecology

## 2024-05-20 ENCOUNTER — Ambulatory Visit: Payer: Self-pay | Admitting: Nurse Practitioner

## 2024-05-20 DIAGNOSIS — D509 Iron deficiency anemia, unspecified: Secondary | ICD-10-CM

## 2024-05-20 DIAGNOSIS — R82998 Other abnormal findings in urine: Secondary | ICD-10-CM

## 2024-05-20 MED ORDER — CIPROFLOXACIN HCL 500 MG PO TABS: 500.0000 mg | ORAL_TABLET | Freq: Two times a day (BID) | ORAL | 0 refills | Status: AC

## 2024-05-21 NOTE — Telephone Encounter (Signed)
 Patient has been notified

## 2024-06-03 ENCOUNTER — Encounter: Payer: Self-pay | Admitting: Hematology and Oncology

## 2024-06-03 ENCOUNTER — Inpatient Hospital Stay: Attending: Hematology and Oncology | Admitting: Hematology and Oncology

## 2024-06-03 ENCOUNTER — Inpatient Hospital Stay

## 2024-06-03 VITALS — BP 129/63 | HR 86 | Temp 98.6°F | Resp 20 | Ht 64.17 in | Wt 171.7 lb

## 2024-06-03 DIAGNOSIS — Z79899 Other long term (current) drug therapy: Secondary | ICD-10-CM | POA: Insufficient documentation

## 2024-06-03 DIAGNOSIS — N184 Chronic kidney disease, stage 4 (severe): Secondary | ICD-10-CM | POA: Insufficient documentation

## 2024-06-03 DIAGNOSIS — Z860100 Personal history of colon polyps, unspecified: Secondary | ICD-10-CM | POA: Insufficient documentation

## 2024-06-03 DIAGNOSIS — I129 Hypertensive chronic kidney disease with stage 1 through stage 4 chronic kidney disease, or unspecified chronic kidney disease: Secondary | ICD-10-CM | POA: Diagnosis not present

## 2024-06-03 DIAGNOSIS — Z803 Family history of malignant neoplasm of breast: Secondary | ICD-10-CM | POA: Diagnosis not present

## 2024-06-03 DIAGNOSIS — D631 Anemia in chronic kidney disease: Secondary | ICD-10-CM | POA: Diagnosis not present

## 2024-06-03 LAB — COMPREHENSIVE METABOLIC PANEL WITH GFR
ALT: 11 U/L (ref 0–44)
AST: 14 U/L — ABNORMAL LOW (ref 15–41)
Albumin: 3.9 g/dL (ref 3.5–5.0)
Alkaline Phosphatase: 77 U/L (ref 38–126)
Anion gap: 10 (ref 5–15)
BUN: 43 mg/dL — ABNORMAL HIGH (ref 8–23)
CO2: 23 mmol/L (ref 22–32)
Calcium: 9.9 mg/dL (ref 8.9–10.3)
Chloride: 103 mmol/L (ref 98–111)
Creatinine, Ser: 1.63 mg/dL — ABNORMAL HIGH (ref 0.44–1.00)
GFR, Estimated: 32 mL/min — ABNORMAL LOW (ref >60)
Glucose, Bld: 112 mg/dL — ABNORMAL HIGH (ref 70–99)
Potassium: 4.4 mmol/L (ref 3.5–5.1)
Sodium: 136 mmol/L (ref 135–145)
Total Bilirubin: 0.5 mg/dL (ref 0.0–1.2)
Total Protein: 7.9 g/dL (ref 6.5–8.1)

## 2024-06-03 LAB — CBC WITH DIFFERENTIAL/PLATELET
Abs Immature Granulocytes: 0.09 K/uL — ABNORMAL HIGH (ref 0.00–0.07)
Basophils Absolute: 0.1 K/uL (ref 0.0–0.1)
Basophils Relative: 1 %
Eosinophils Absolute: 0.2 K/uL (ref 0.0–0.5)
Eosinophils Relative: 1 %
HCT: 32.6 % — ABNORMAL LOW (ref 36.0–46.0)
Hemoglobin: 10.8 g/dL — ABNORMAL LOW (ref 12.0–15.0)
Immature Granulocytes: 1 %
Lymphocytes Relative: 10 %
Lymphs Abs: 1.4 K/uL (ref 0.7–4.0)
MCH: 30.8 pg (ref 26.0–34.0)
MCHC: 33.1 g/dL (ref 30.0–36.0)
MCV: 92.9 fL (ref 80.0–100.0)
Monocytes Absolute: 1.1 K/uL — ABNORMAL HIGH (ref 0.1–1.0)
Monocytes Relative: 8 %
Neutro Abs: 10.9 K/uL — ABNORMAL HIGH (ref 1.7–7.7)
Neutrophils Relative %: 79 %
Platelets: 238 K/uL (ref 150–400)
RBC: 3.51 MIL/uL — ABNORMAL LOW (ref 3.87–5.11)
RDW: 13 % (ref 11.5–15.5)
WBC: 13.7 K/uL — ABNORMAL HIGH (ref 4.0–10.5)
nRBC: 0 % (ref 0.0–0.2)

## 2024-06-03 LAB — IRON AND TIBC
Iron: 29 ug/dL (ref 28–170)
Saturation Ratios: 11 % (ref 10.4–31.8)
TIBC: 271 ug/dL (ref 250–450)
UIBC: 242 ug/dL

## 2024-06-03 LAB — VITAMIN B12: Vitamin B-12: 281 pg/mL (ref 180–914)

## 2024-06-03 LAB — SEDIMENTATION RATE: Sed Rate: 80 mm/h — ABNORMAL HIGH (ref 0–30)

## 2024-06-03 LAB — LACTATE DEHYDROGENASE: LDH: 114 U/L (ref 98–192)

## 2024-06-03 LAB — FERRITIN: Ferritin: 172 ng/mL (ref 11–307)

## 2024-06-03 NOTE — Assessment & Plan Note (Signed)
 She has progressive chronic kidney failure In talking to her, it appears that it could be due to either medication effect versus inadequate oral fluid intake I recommend the patient to increase oral fluid intake as tolerated I will also check for multiple myeloma I will see her next week for further follow-up If test results are unremarkable, we will consider potentially erythropoietin  stimulating agent if her hemoglobin is less than 10

## 2024-06-03 NOTE — Assessment & Plan Note (Signed)
 The most likely cause of anemia is from chronic kidney disease Iron deficiency cannot be excluded as I do not have baseline iron studies and she has been taking oral iron supplement for several months I will recheck iron studies as well I will also check for abnormal paraproteinemia I will see her next week for further follow-up

## 2024-06-03 NOTE — Progress Notes (Signed)
 [facility] CONSULT NOTE  Patient Care Team: Gladis Mustard, FNP as PCP - General (Nurse Practitioner)  ASSESSMENT & PLAN:  Anemia in chronic kidney disease The most likely cause of anemia is from chronic kidney disease Iron deficiency cannot be excluded as I do not have baseline iron studies and she has been taking oral iron supplement for several months I will recheck iron studies as well I will also check for abnormal paraproteinemia I will see her next week for further follow-up  Chronic kidney disease (CKD), stage IV (severe) (HCC) She has progressive chronic kidney failure In talking to her, it appears that it could be due to either medication effect versus inadequate oral fluid intake I recommend the patient to increase oral fluid intake as tolerated I will also check for multiple myeloma I will see her next week for further follow-up If test results are unremarkable, we will consider potentially erythropoietin  stimulating agent if her hemoglobin is less than 10 Orders Placed This Encounter  Procedures   CBC with Differential (Cancer Center Only)    Standing Status:   Future    Number of Occurrences:   1    Expiration Date:   06/03/2025   CMP (Cancer Center only)    Standing Status:   Future    Number of Occurrences:   1    Expiration Date:   06/03/2025   Erythropoietin     Standing Status:   Future    Number of Occurrences:   1    Expected Date:   06/03/2024    Expiration Date:   06/03/2025   Iron and Iron Binding Capacity ([facility] only)    Standing Status:   Future    Number of Occurrences:   1    Expiration Date:   06/03/2025   Vitamin B12    Standing Status:   Future    Number of Occurrences:   1    Expiration Date:   06/03/2025   Sedimentation rate    Standing Status:   Future    Number of Occurrences:   1    Expected Date:   06/03/2024    Expiration Date:   06/03/2025   Ferritin    Standing Status:   Future    Number of Occurrences:   1     Expiration Date:   06/03/2025   Lactate dehydrogenase    Standing Status:   Future    Number of Occurrences:   1    Expiration Date:   06/03/2025   Kappa/lambda light chains    Standing Status:   Future    Number of Occurrences:   1    Expiration Date:   06/03/2025   Multiple Myeloma Panel (SPEP&IFE w/QIG)    Standing Status:   Future    Number of Occurrences:   1    Expiration Date:   06/03/2025   CBC with Differential    Standing Status:   Future    Number of Occurrences:   1    Expiration Date:   06/03/2025   Comprehensive metabolic panel    Standing Status:   Future    Number of Occurrences:   1    Expiration Date:   06/03/2025   Iron and TIBC ([facility] ONLY)    Standing Status:   Future    Number of Occurrences:   1    Expiration Date:   06/03/2025    All questions were answered. The patient knows to call the  clinic with any problems, questions or concerns.  The total time spent in the appointment was 60 minutes encounter with patients including review of chart and various tests results, discussions about plan of care and coordination of care plan  Ariana Bedford, MD 8/11/202510:45 AM   CHIEF COMPLAINTS/PURPOSE OF CONSULTATION:  Anemia  HISTORY OF PRESENTING ILLNESS:  Ariana Dillon 78 y.o. female is here because of anemia  She was found to have abnormal CBC from recent blood work I have the opportunity to review her CBC dated back to 2014 At baseline, her hemoglobin is normal, as high as 13.4 in 2016 On May 12, 2023, hemoglobin is reduced to 11.9 On November 13, 2023, hemoglobin is down to 10.2 On May 10 2024, hemoglobin is down to 10.1 On review on her other blood work including serum creatinine it has been chronically elevated since 2020 Her baseline creatinine was normal in the past, less than 1 Starting in 2020, it is in the 1.1 range On May 12, 2023, creatinine was 1.35 On November 13, 2023, creatinine was 1.39 Most recently on May 04, 2017 2025,  creatinine was 2.2 with BUN of 50 Her creatinine clearance was reduced to estimated EGFR of 22 mL/min  She denies recent chest pain on exertion, pre-syncopal episodes, or palpitations.  She does have some fatigue and mild shortness of breath on exertion She had not noticed any recent bleeding such as epistaxis, hematuria or hematochezia The patient denies over the counter NSAID ingestion. She is not on antiplatelets agents. Her last colonoscopy was on Feb 24, 2023, report indicated signs of hemorrhoids and colon polyps along with mild diverticula She had no prior history or diagnosis of cancer. Her age appropriate screening programs are up-to-date. She denies any pica and eats a variety of diet, although in going through her diet history, it appears that she does not eat iron rich food She donated blood once but has never received blood transfusion The patient was prescribed oral iron supplements and she takes 1 daily in the morning  MEDICAL HISTORY:  Past Medical History:  Diagnosis Date   Anemia    Colon polyps    Diverticulosis    Hypertension     SURGICAL HISTORY: Past Surgical History:  Procedure Laterality Date   BREAST CYST ASPIRATION Right     SOCIAL HISTORY: Social History   Socioeconomic History   Marital status: Widowed    Spouse name: Not on file   Number of children: 1   Years of education: Not on file   Highest education level: Not on file  Occupational History   Occupation: retired   Occupation: retired  Tobacco Use   Smoking status: Never   Smokeless tobacco: Never  Vaping Use   Vaping status: Never Used  Substance and Sexual Activity   Alcohol  use: No   Drug use: No   Sexual activity: Not Currently  Other Topics Concern   Not on file  Social History Narrative   Lives alone - husband passed from [place] in 2021   Daughter lives close by   Social Drivers of Health   Financial Resource Strain: Low Risk  (06/21/2023)   Overall Financial Resource  Strain (CARDIA)    Difficulty of Paying Living Expenses: Not hard at all  Food Insecurity: No Food Insecurity (06/03/2024)   Hunger Vital Sign    Worried About Running Out of Food in the Last Year: Never true    Ran Out of Food in the Last Year: Never true  Transportation Needs: Unknown (06/03/2024)   PRAPARE - Administrator, Civil Service (Medical): No    Lack of Transportation (Non-Medical): Not on file  Physical Activity: Sufficiently Active (06/21/2023)   Exercise Vital Sign    Days of Exercise per Week: 5 days    Minutes of Exercise per Session: 30 min  Stress: No Stress Concern Present (06/21/2023)   [employer] of Occupational Health - Occupational Stress Questionnaire    Feeling of Stress : Not at all  Social Connections: Moderately Integrated (06/21/2023)   Social Connection and Isolation Panel    Frequency of Communication with Friends and Family: More than three times a week    Frequency of Social Gatherings with Friends and Family: More than three times a week    Attends Religious Services: More than 4 times per year    Active Member of [employer] or Organizations: Yes    Attends Banker Meetings: More than 4 times per year    Marital Status: Widowed  Intimate Partner Violence: Not At Risk (06/03/2024)   Humiliation, Afraid, Rape, and Kick questionnaire    Fear of Current or Ex-Partner: No    Emotionally Abused: No    Physically Abused: No    Sexually Abused: No    FAMILY HISTORY: Family History  Problem Relation Age of Onset   Diabetes Mother    Alzheimer's disease Mother    Cancer Father        lung   Polymyositis Sister    Heart disease Brother    Heart disease Brother    Heart disease Brother    Breast cancer Maternal Aunt    Colon cancer Neg Hx    Colon polyps Neg Hx    Esophageal cancer Neg Hx    Rectal cancer Neg Hx    Stomach cancer Neg Hx     ALLERGIES:  is allergic to lipitor [atorvastatin], penicillins, and sulfa  antibiotics.  MEDICATIONS:  Current Outpatient Medications  Medication Sig Dispense Refill   estradiol  (ESTRACE ) 0.1 MG/GM vaginal cream Place 0.5g nightly for two weeks then twice a week after 30 g 11   ferrous sulfate  325 (65 FE) MG EC tablet Take 1 tablet (325 mg total) by mouth daily. 90 tablet 1   lisinopril -hydrochlorothiazide  (ZESTORETIC ) 20-12.5 MG tablet Take 1 tablet by mouth daily. 90 tablet 1   Omega 3 1000 MG CAPS TAKE 2 CAPSULES (2,000 MG TOTAL) BY MOUTH DAILY. 180 capsule 1   No current facility-administered medications for this visit.    REVIEW OF SYSTEMS: She has lost about 10 pounds over many months due to poor appetite in general.  She is felt that is difficult to eat on her own Constitutional: Denies fevers, chills or abnormal night sweats Eyes: Denies blurriness of vision, double vision or watery eyes Ears, nose, mouth, throat, and face: Denies mucositis or sore throat Respiratory: Denies cough, dyspnea or wheezes Cardiovascular: Denies palpitation, chest discomfort with occasional lower extremity swelling Gastrointestinal:  Denies nausea, heartburn or change in bowel habits Skin: Denies abnormal skin rashes Lymphatics: Denies new lymphadenopathy or easy bruising Neurological:Denies numbness, tingling or new weaknesses Behavioral/Psych: Mood is stable, no new changes  All other systems were reviewed with the patient and are negative.  PHYSICAL EXAMINATION: ECOG PERFORMANCE STATUS: 1 - Symptomatic but completely ambulatory  Vitals:   06/03/24 1008 06/03/24 1011  BP: (!) 143/68 129/63  Pulse: 86   Resp: 20   Temp: 98.6 F (37 C)   SpO2: 99%  Filed Weights   06/03/24 1008  Weight: 171 lb 11.8 oz (77.9 kg)    GENERAL:alert, no distress and comfortable SKIN: skin color, texture, turgor are normal, no rashes or significant lesions EYES: normal, conjunctiva are pink and non-injected, sclera clear OROPHARYNX:no exudate, no erythema and lips, buccal mucosa,  and tongue normal  NECK: supple, thyroid  normal size, non-tender, without nodularity LYMPH:  no palpable lymphadenopathy in the cervical, axillary or inguinal LUNGS: clear to auscultation and percussion with normal breathing effort HEART: regular rate & rhythm and no murmurs and mild lower extremity edema ABDOMEN:abdomen soft, non-tender and normal bowel sounds Musculoskeletal:no cyanosis of digits and no clubbing  PSYCH: alert & oriented x 3 with fluent speech NEURO: no focal motor/sensory deficits

## 2024-06-04 LAB — KAPPA/LAMBDA LIGHT CHAINS
Kappa free light chain: 52.4 mg/L — ABNORMAL HIGH (ref 3.3–19.4)
Kappa, lambda light chain ratio: 1.05 (ref 0.26–1.65)
Lambda free light chains: 50.1 mg/L — ABNORMAL HIGH (ref 5.7–26.3)

## 2024-06-04 LAB — ERYTHROPOIETIN: Erythropoietin: 6.8 m[IU]/mL (ref 2.6–18.5)

## 2024-06-10 ENCOUNTER — Inpatient Hospital Stay (HOSPITAL_BASED_OUTPATIENT_CLINIC_OR_DEPARTMENT_OTHER): Admitting: Hematology and Oncology

## 2024-06-10 ENCOUNTER — Encounter: Payer: Self-pay | Admitting: Hematology and Oncology

## 2024-06-10 VITALS — BP 137/68 | HR 95 | Temp 99.3°F | Resp 19 | Ht 64.0 in | Wt 170.0 lb

## 2024-06-10 DIAGNOSIS — Z860100 Personal history of colon polyps, unspecified: Secondary | ICD-10-CM | POA: Diagnosis not present

## 2024-06-10 DIAGNOSIS — N184 Chronic kidney disease, stage 4 (severe): Secondary | ICD-10-CM | POA: Diagnosis not present

## 2024-06-10 DIAGNOSIS — I129 Hypertensive chronic kidney disease with stage 1 through stage 4 chronic kidney disease, or unspecified chronic kidney disease: Secondary | ICD-10-CM | POA: Diagnosis not present

## 2024-06-10 DIAGNOSIS — Z803 Family history of malignant neoplasm of breast: Secondary | ICD-10-CM | POA: Diagnosis not present

## 2024-06-10 DIAGNOSIS — Z79899 Other long term (current) drug therapy: Secondary | ICD-10-CM | POA: Diagnosis not present

## 2024-06-10 DIAGNOSIS — D631 Anemia in chronic kidney disease: Secondary | ICD-10-CM

## 2024-06-10 LAB — MULTIPLE MYELOMA PANEL, SERUM
Albumin SerPl Elph-Mcnc: 3.4 g/dL (ref 2.9–4.4)
Albumin/Glob SerPl: 1 (ref 0.7–1.7)
Alpha 1: 0.3 g/dL (ref 0.0–0.4)
Alpha2 Glob SerPl Elph-Mcnc: 0.9 g/dL (ref 0.4–1.0)
B-Globulin SerPl Elph-Mcnc: 1.1 g/dL (ref 0.7–1.3)
Gamma Glob SerPl Elph-Mcnc: 1.3 g/dL (ref 0.4–1.8)
Globulin, Total: 3.6 g/dL (ref 2.2–3.9)
IgA: 263 mg/dL (ref 64–422)
IgG (Immunoglobin G), Serum: 1372 mg/dL (ref 586–1602)
IgM (Immunoglobulin M), Srm: 173 mg/dL (ref 26–217)
Total Protein ELP: 7 g/dL (ref 6.0–8.5)

## 2024-06-10 NOTE — Assessment & Plan Note (Addendum)
 I reviewed multiple test results with the patient Overall, the cause of anemia is most consistent with anemia of chronic kidney disease Myeloma panel is still pending and I will call her with final results tomorrow or the next day With her hemoglobin almost at 11 range, there is no benefit for erythropoietin  stimulating agent right now I recommend close monitoring and follow-up with her primary care doctor and her nephrologist If her hemoglobin drops to less than 10, will bring her back and start her on erythropoietin  stimulating agents

## 2024-06-10 NOTE — Progress Notes (Signed)
 [facility] OFFICE PROGRESS NOTE  Patient Care Team: Gladis Mustard, FNP as PCP - General (Nurse Practitioner)  Assessment & Plan Anemia in stage 4 chronic kidney disease [facility]) I reviewed multiple test results with the patient Overall, the cause of anemia is most consistent with anemia of chronic kidney disease Myeloma panel is still pending and I will call her with final results tomorrow or the next day With her hemoglobin almost at 11 range, there is no benefit for erythropoietin  stimulating agent right now I recommend close monitoring and follow-up with her primary care doctor and her nephrologist If her hemoglobin drops to less than 10, will bring her back and start her on erythropoietin  stimulating agents Chronic kidney disease (CKD), stage IV (severe) (HCC) She has progressive chronic kidney failure In talking to her, it appears that it could be due to either medication effect versus inadequate oral fluid intake I recommend the patient to increase oral fluid intake as tolerated She will continue close monitoring with her primary care doctor and nephrologist  No orders of the defined types were placed in this encounter.    Ariana Bedford, MD  INTERVAL HISTORY: she returns for surveillance follow-up to review test results She is not symptomatic from anemia We reviewed multiple test results and discussed future plan of care  PHYSICAL EXAMINATION: ECOG PERFORMANCE STATUS: 0 - Asymptomatic  Vitals:   06/10/24 1140  BP: 137/68  Pulse: 95  Resp: 19  Temp: 99.3 F (37.4 C)  SpO2: 100%   Filed Weights   06/10/24 1140  Weight: 170 lb (77.1 kg)    Relevant data reviewed during this visit included CBC, CMP, serum light chains, iron studies, vitamin B12 and serum erythropoietin  level  Summary of hematologic history Ariana Dillon 78 y.o. female is here because of anemia  She was found to have abnormal CBC from recent blood work I have the opportunity to  review her CBC dated back to 2014 At baseline, her hemoglobin is normal, as high as 13.4 in 2016 On May 12, 2023, hemoglobin is reduced to 11.9 On November 13, 2023, hemoglobin is down to 10.2 On May 10 2024, hemoglobin is down to 10.1 On review on her other blood work including serum creatinine it has been chronically elevated since 2020 Her baseline creatinine was normal in the past, less than 1 Starting in 2020, it is in the 1.1 range On May 12, 2023, creatinine was 1.35 On November 13, 2023, creatinine was 1.39 Most recently on May 04, 2017 2025, creatinine was 2.2 with BUN of 50 Her creatinine clearance was reduced to estimated EGFR of 22 mL/min  She denies recent chest pain on exertion, pre-syncopal episodes, or palpitations.  She does have some fatigue and mild shortness of breath on exertion She had not noticed any recent bleeding such as epistaxis, hematuria or hematochezia The patient denies over the counter NSAID ingestion. She is not on antiplatelets agents. Her last colonoscopy was on Feb 24, 2023, report indicated signs of hemorrhoids and colon polyps along with mild diverticula She had no prior history or diagnosis of cancer. Her age appropriate screening programs are up-to-date. She denies any pica and eats a variety of diet, although in going through her diet history, it appears that she does not eat iron rich food She donated blood once but has never received blood transfusion The patient was prescribed oral iron supplements and she takes 1 daily in the morning

## 2024-06-10 NOTE — Assessment & Plan Note (Addendum)
 She has progressive chronic kidney failure In talking to her, it appears that it could be due to either medication effect versus inadequate oral fluid intake I recommend the patient to increase oral fluid intake as tolerated She will continue close monitoring with her primary care doctor and nephrologist

## 2024-06-11 ENCOUNTER — Telehealth: Payer: Self-pay | Admitting: *Deleted

## 2024-06-11 NOTE — Telephone Encounter (Signed)
-----   Message from Almarie Bedford sent at 06/11/2024  9:11 AM EDT ----- Pls call her today, her final test results for myeloma came back normal

## 2024-06-11 NOTE — Telephone Encounter (Signed)
 Per Dr. Lonn, called pt with message below, pt verbalized understanding.

## 2024-06-13 DIAGNOSIS — H524 Presbyopia: Secondary | ICD-10-CM | POA: Diagnosis not present

## 2024-06-17 ENCOUNTER — Ambulatory Visit: Admitting: Obstetrics and Gynecology

## 2024-06-17 VITALS — BP 149/69 | HR 80

## 2024-06-17 DIAGNOSIS — N813 Complete uterovaginal prolapse: Secondary | ICD-10-CM

## 2024-06-17 NOTE — Progress Notes (Unsigned)
 [facility]   Subjective:     Chief Complaint:  Chief Complaint  Patient presents with   Pessary Check    Ariana Dillon is a 78 y.o. female is here for pessary check.   History of Present Illness: Ariana Dillon is a 78 y.o. female with stage IV pelvic organ prolapse and mixed incontinence who presents for a pessary check. She is using a size #6 long stem gellhorn pessary. The pessary has been working well and she has no complaints. She is using vaginal estrogen. She denies vaginal bleeding. But she is having   Sometimes it comes out around when she using thebathroom.   Can hold her urine for longer periods of time. Barely has any leakage. Still wearing diapers but they are dry.   Past Medical History: Patient  has a past medical history of Anemia, Colon polyps, Diverticulosis, and Hypertension.   Past Surgical History: She  has a past surgical history that includes Breast cyst aspiration (Right).   Medications: She has a current medication list which includes the following prescription(s): estradiol , ferrous sulfate , lisinopril -hydrochlorothiazide , and omega 3.   Allergies: Patient is allergic to lipitor [atorvastatin], penicillins, and sulfa antibiotics.   Social History: Patient  reports that she has never smoked. She has never used smokeless tobacco. She reports that she does not drink alcohol  and does not use drugs.      Objective:    Physical Exam: BP (!) 148/62   Pulse 80  Gen: No apparent distress, A&O x 3. Detailed Urogynecologic Evaluation:  Pelvic Exam: Normal external female genitalia; Bartholin's and Skene's glands normal in appearance; urethral meatus {urethra:24773}, no urethral masses or discharge. The pessary was noted to be {in place:24774}. It was removed and cleaned. Speculum exam revealed {vaginal lesions:24775} in the vagina. The pessary was replaced. It was comfortable to the patient and fit well.  Pessary cube #5 Lot [id number]     No data  to display          Laboratory Results: Urine dipstick shows: {ua dip:315374::negative for all components}.    Assessment/Plan:    Assessment: Ariana Dillon is a 78 y.o. with {PFD symptoms:24771} here for a pessary check. She is doing well.  Plan: She will {pessary plan:24776}. She will continue to use {lubricant:24777}. She will follow-up in *** {days/wks/mos/yrs:310907} for a pessary check or sooner as needed.  All questions were answered.   Time Spent:

## 2024-06-18 ENCOUNTER — Encounter: Payer: Self-pay | Admitting: Obstetrics and Gynecology

## 2024-06-21 ENCOUNTER — Ambulatory Visit (INDEPENDENT_AMBULATORY_CARE_PROVIDER_SITE_OTHER): Payer: Medicare HMO

## 2024-06-21 VITALS — Ht 64.0 in | Wt 170.0 lb

## 2024-06-21 DIAGNOSIS — Z0000 Encounter for general adult medical examination without abnormal findings: Secondary | ICD-10-CM | POA: Diagnosis not present

## 2024-06-21 DIAGNOSIS — Z1231 Encounter for screening mammogram for malignant neoplasm of breast: Secondary | ICD-10-CM

## 2024-06-21 NOTE — Patient Instructions (Signed)
 Ariana Dillon , Thank you for taking time out of your busy schedule to complete your Annual Wellness Visit with me. I enjoyed our conversation and look forward to speaking with you again next year. I, as well as your care team,  appreciate your ongoing commitment to your health goals. Please review the following plan we discussed and let me know if I can assist you in the future.  Your Game plan/ To Do List  Referrals/Orders: If you haven't heard from the office you've been referred to, please reach out to them at the phone provided.   Your mammogram was scheduled for October 14, 2024 at 11:40 on the Mobile Mammogram Bus in the [facility] parking lot  Follow up Visits: We will see or speak with you next year for your Next Medicare AWV with our clinical staff  Clinician Recommendations:  Aim for 30 minutes of exercise or brisk walking, 6-8 glasses of water, and 5 servings of fruits and vegetables each day.    Wishing you many blessings and good health during the next year until our next visit.  -Darci Lykins   This is a list of the screenings recommended for you:  Health Maintenance  Topic Date Due   COVID-19 Vaccine (3 - Moderna risk series) 11/18/2020   Flu Shot  07/24/2024*   Zoster (Shingles) Vaccine (1 of 2) 08/10/2024*   DTaP/Tdap/Td vaccine (2 - Td or Tdap) 11/12/2024*   Mammogram  10/01/2024   Medicare Annual Wellness Visit  06/21/2025   DEXA scan (bone density measurement)  11/14/2025   Pneumococcal Vaccine for age over 4  Completed   Hepatitis C Screening  Completed   HPV Vaccine  Aged Out   Meningitis B Vaccine  Aged Out   Colon Cancer Screening  Discontinued  *Topic was postponed. The date shown is not the original due date.    Advanced directives: (Declined) Advance directive discussed with you today. Even though you declined this today, please call our office should you change your mind, and we can give you the proper paperwork for you to fill out. Advance Care Planning is important  because it:  [x]  Makes sure you receive the medical care that is consistent with your values, goals, and preferences  [x]  It provides guidance to your family and loved ones and reduces their decisional burden about whether or not they are making the right decisions based on your wishes.  Follow the link provided in your after visit summary or read over the paperwork we have mailed to you to help you started getting your Advance Directives in place. If you need assistance in completing these, please reach out to us  so that we can help you!  Information on Advanced Care Planning can be found at [place]  Secretary of [facility] Advance Health Care Directives Advance Health Care Directives ([email or web address]   See attachments for Preventive Care and Fall Prevention Tips.

## 2024-06-21 NOTE — Progress Notes (Signed)
 Please attest and cosign this visit due to patients primary care provider not being in the office at the time the visit was completed.  Subjective:   Ariana Dillon is a 78 y.o. who presents for a Medicare Wellness preventive visit.  As a reminder, Annual Wellness Visits don't include a physical exam, and some assessments may be limited, especially if this visit is performed virtually. We may recommend an in-person follow-up visit with your provider if needed.  Visit Complete: Virtual I connected with  Ariana Dillon on 06/21/24 by a audio enabled telemedicine application and verified that I am speaking with the correct person using two identifiers.  Patient Location: Home  Provider Location: Home Office  I discussed the limitations of evaluation and management by telemedicine. The patient expressed understanding and agreed to proceed.  Vital Signs: Because this visit was a virtual/telehealth visit, some criteria may be missing or patient reported. Any vitals not documented were not able to be obtained and vitals that have been documented are patient reported.  VideoDeclined- This patient declined Librarian, academic. Therefore the visit was completed with audio only.  Persons Participating in Visit: Patient.  AWV Questionnaire: Yes: Patient Medicare AWV questionnaire was completed by the patient on 06/20/2024; I have confirmed that all information answered by patient is correct and no changes since this date.  Cardiac Risk Factors include: advanced age (>24men, >61 women);dyslipidemia;hypertension;sedentary lifestyle     Objective:    Today's Vitals   06/20/24 9061 06/21/24 1034  Weight:  170 lb (77.1 kg)  Height:  5' 4 (1.626 m)  PainSc: 0-No pain 0-No pain   Body mass index is 29.18 kg/m.     06/21/2024   10:33 AM 06/10/2024   11:44 AM 06/03/2024   10:16 AM 06/21/2023    3:39 PM 05/31/2022    1:38 PM 05/03/2021    4:12 PM 10/27/2014   11:55 AM  Advanced  Directives  Does Patient Have a Medical Advance Directive? No No No No No No No   Would patient like information on creating a medical advance directive? No - Patient declined No - Patient declined No - Patient declined Yes (MAU/Ambulatory/Procedural Areas - Information given) No - Patient declined No - Patient declined Yes - Educational materials given      Data saved with a previous flowsheet row definition    Current Medications (verified) Outpatient Encounter Medications as of 06/21/2024  Medication Sig   estradiol  (ESTRACE ) 0.1 MG/GM vaginal cream Place 0.5g nightly for two weeks then twice a week after   ferrous sulfate  325 (65 FE) MG EC tablet Take 1 tablet (325 mg total) by mouth daily.   lisinopril -hydrochlorothiazide  (ZESTORETIC ) 20-12.5 MG tablet Take 1 tablet by mouth daily.   Omega 3 1000 MG CAPS TAKE 2 CAPSULES (2,000 MG TOTAL) BY MOUTH DAILY.   No facility-administered encounter medications on file as of 06/21/2024.    Allergies (verified) Lipitor [atorvastatin], Penicillins, and Sulfa antibiotics   History: Past Medical History:  Diagnosis Date   Allergy 10 years ago   Anemia    Colon polyps    Diverticulosis    Heart murmur    Hyperlipidemia    Hypertension    Past Surgical History:  Procedure Laterality Date   BREAST CYST ASPIRATION Right    Family History  Problem Relation Age of Onset   Diabetes Mother    Alzheimer's disease Mother    Miscarriages / [place] Mother    Cancer Father  lung   Polymyositis Sister    Heart disease Brother    Heart disease Brother    Heart disease Brother    Breast cancer Maternal Aunt    Heart disease Brother    Colon cancer Neg Hx    Colon polyps Neg Hx    Esophageal cancer Neg Hx    Rectal cancer Neg Hx    Stomach cancer Neg Hx    Social History   Socioeconomic History   Marital status: Widowed    Spouse name: Not on file   Number of children: 1   Years of education: Not on file   Highest education  level: Associate degree: occupational, Scientist, product/process development, or vocational program  Occupational History   Occupation: retired   Occupation: retired  Tobacco Use   Smoking status: Never   Smokeless tobacco: Never  Vaping Use   Vaping status: Never Used  Substance and Sexual Activity   Alcohol  use: No   Drug use: No   Sexual activity: Not Currently  Other Topics Concern   Not on file  Social History Narrative   Lives alone - husband passed from [place] in 2021   Daughter lives close by   Social Drivers of Health   Financial Resource Strain: Low Risk  (06/20/2024)   Overall Financial Resource Strain (CARDIA)    Difficulty of Paying Living Expenses: Not hard at all  Food Insecurity: No Food Insecurity (06/20/2024)   Hunger Vital Sign    Worried About Running Out of Food in the Last Year: Never true    Ran Out of Food in the Last Year: Never true  Transportation Needs: No Transportation Needs (06/20/2024)   PRAPARE - Administrator, Civil Service (Medical): No    Lack of Transportation (Non-Medical): No  Physical Activity: Insufficiently Active (06/20/2024)   Exercise Vital Sign    Days of Exercise per Week: 1 day    Minutes of Exercise per Session: 60 min  Stress: No Stress Concern Present (06/20/2024)   [employer] of Occupational Health - Occupational Stress Questionnaire    Feeling of Stress: Not at all  Social Connections: Moderately Integrated (06/20/2024)   Social Connection and Isolation Panel    Frequency of Communication with Friends and Family: More than three times a week    Frequency of Social Gatherings with Friends and Family: Once a week    Attends Religious Services: More than 4 times per year    Active Member of [employer] or Organizations: Yes    Attends Banker Meetings: More than 4 times per year    Marital Status: Widowed    Tobacco Counseling Counseling given: Yes    Clinical Intake:  Pre-visit preparation completed: Yes  Pain :  No/denies pain Pain Score: 0-No pain     BMI - recorded: 29.18 Nutritional Status: BMI 25 -29 Overweight Nutritional Risks: None Diabetes: No  No results found for: HGBA1C   How often do you need to have someone help you when you read instructions, pamphlets, or other written materials from your doctor or pharmacy?: 1 - Never  Interpreter Needed?: No  Information entered by :: Destany Severns W CMA (AAMA)   Activities of Daily Living     06/20/2024    9:38 AM  In your present state of health, do you have any difficulty performing the following activities:  Hearing? 0  Vision? 0  Difficulty concentrating or making decisions? 0  Walking or climbing stairs? 0  Dressing or bathing? 0  Doing errands, shopping? 0  Preparing Food and eating ? N  Using the Toilet? N  In the past six months, have you accidently leaked urine? Y  Do you have problems with loss of bowel control? N  Managing your Medications? N  Managing your Finances? N  Housekeeping or managing your Housekeeping? N    Patient Care Team: Gladis Mustard, FNP as PCP - General (Nurse Practitioner) Onesimo Oneil LABOR, MD as Consulting Physician (Orthopedic Surgery) Marilynne Rosaline SAILOR, MD as Consulting Physician (Obstetrics and Gynecology) Lonn Olam LABOR, MD as Referring Physician (Obstetrics) Glena Collard, MD as Referring Physician (Obstetrics and Gynecology) Vicci Mcardle, OD (Optometry)  I have updated your Care Teams any recent Medical Services you may have received from other providers in the past year.     Assessment:   This is a routine wellness examination for Ariana Dillon.  Hearing/Vision screen Hearing Screening - Comments:: Patient denies any hearing difficulties.   Vision Screening - Comments:: Wears rx glasses - up to date with routine eye exams with  [facility] in [place]    Goals Addressed               This Visit's Progress     Exercise at least 1 time a week (pt-stated)        I  want to get back into exercising at least 1 time a week.        Depression Screen     06/21/2024   11:06 AM 06/10/2024   11:42 AM 06/03/2024   10:14 AM 05/10/2024    2:45 PM 11/13/2023    3:01 PM 06/21/2023    3:38 PM 05/12/2023    2:23 PM  PHQ 2/9 Scores  PHQ - 2 Score 0 0 0 0 3 0 0  PHQ- 9 Score 0    6 0 1    Fall Risk     06/20/2024    9:38 AM 05/10/2024    2:45 PM 11/13/2023    3:01 PM 06/21/2023    3:37 PM 05/12/2023    2:24 PM  Fall Risk   Falls in the past year? 0 0 0 0 0  Number falls in past yr: 0   0   Injury with Fall? 0   0   Risk for fall due to : No Fall Risks   No Fall Risks   Follow up Falls evaluation completed;Education provided;Falls prevention discussed   Falls prevention discussed     MEDICARE RISK AT HOME:  Medicare Risk at Home Any stairs in or around the home?: (Patient-Rptd) Yes If so, are there any without handrails?: (Patient-Rptd) No Home free of loose throw rugs in walkways, pet beds, electrical cords, etc?: (Patient-Rptd) No Adequate lighting in your home to reduce risk of falls?: (Patient-Rptd) Yes Life alert?: (Patient-Rptd) No Use of a cane, walker or w/c?: (Patient-Rptd) No Grab bars in the bathroom?: (Patient-Rptd) Yes Shower chair or bench in shower?: (Patient-Rptd) Yes Elevated toilet seat or a handicapped toilet?: (Patient-Rptd) Yes  TIMED UP AND GO:  Was the test performed?  No  Cognitive Function: 6CIT completed        06/21/2024   11:05 AM 06/21/2023    3:40 PM 05/31/2022    1:49 PM 05/03/2021    4:14 PM  6CIT Screen  What Year? 0 points 0 points 0 points 0 points  What month? 0 points 0 points 0 points 0 points  What time? 0 points 0 points 0 points 0 points  Count back from 20 0 points 0 points 0 points 0 points  Months in reverse 0 points 0 points 0 points 0 points  Repeat phrase 0 points 0 points 2 points 0 points  Total Score 0 points 0 points 2 points 0 points    Immunizations Immunization History  Administered  Date(s) Administered   Fluad Quad(high Dose 65+) 07/24/2019, 07/22/2020, 09/14/2021, 08/06/2022   INFLUENZA, HIGH DOSE SEASONAL PF 08/10/2018, 08/05/2023   Influenza,inj,Quad PF,6+ Mos 08/14/2013, 08/12/2014, 08/03/2015, 07/25/2016, 09/07/2017   Influenza-Unspecified 08/28/2017   Moderna SARS-COV2 Booster Vaccination 10/21/2020   Moderna Sars-Covid-2 Vaccination 11/19/2019, 12/17/2019   Pneumococcal Conjugate-13 10/09/2013   Pneumococcal Polysaccharide-23 05/01/2015   Tdap 10/09/2013   Zoster, Live 09/26/2014    Screening Tests Health Maintenance  Topic Date Due   COVID-19 Vaccine (3 - Moderna risk series) 11/18/2020   INFLUENZA VACCINE  07/24/2024 (Originally 05/24/2024)   Zoster Vaccines- Shingrix (1 of 2) 08/10/2024 (Originally 07/04/1965)   DTaP/Tdap/Td (2 - Td or Tdap) 11/12/2024 (Originally 10/10/2023)   MAMMOGRAM  10/01/2024   Medicare Annual Wellness (AWV)  06/21/2025   DEXA SCAN  11/14/2025   Pneumococcal Vaccine: 50+ Years  Completed   Hepatitis C Screening  Completed   HPV VACCINES  Aged Out   Meningococcal B Vaccine  Aged Out   Colonoscopy  Discontinued    Health Maintenance  Health Maintenance Due  Topic Date Due   COVID-19 Vaccine (3 - Moderna risk series) 11/18/2020   Health Maintenance Items Addressed: Mammogram scheduled  Additional Screening:  Vision Screening: Recommended annual ophthalmology exams for early detection of glaucoma and other disorders of the eye. Would you like a referral to an eye doctor? No    Dental Screening: Recommended annual dental exams for proper oral hygiene  Community Resource Referral / Chronic Care Management: CRR required this visit?  No   CCM required this visit?  No   Plan:    I have personally reviewed and noted the following in the patient's chart:   Medical and social history Use of alcohol , tobacco or illicit drugs  Current medications and supplements including opioid prescriptions. Patient is not currently  taking opioid prescriptions. Functional ability and status Nutritional status Physical activity Advanced directives List of other physicians Hospitalizations, surgeries, and ER visits in previous 12 months Vitals Screenings to include cognitive, depression, and falls Referrals and appointments  In addition, I have reviewed and discussed with patient certain preventive protocols, quality metrics, and best practice recommendations. A written personalized care plan for preventive services as well as general preventive health recommendations were provided to patient.   Jaeven Wanzer, CMA   06/21/2024   After Visit Summary: (MyChart) Due to this being a telephonic visit, the after visit summary with patients personalized plan was offered to patient via MyChart   Notes: Nothing significant to report at this time.

## 2024-07-01 ENCOUNTER — Other Ambulatory Visit (HOSPITAL_COMMUNITY)
Admission: RE | Admit: 2024-07-01 | Discharge: 2024-07-01 | Disposition: A | Discharge status: Home / Self Care | Source: Other Acute Inpatient Hospital | Attending: Obstetrics and Gynecology | Admitting: Obstetrics and Gynecology

## 2024-07-01 ENCOUNTER — Ambulatory Visit (INDEPENDENT_AMBULATORY_CARE_PROVIDER_SITE_OTHER): Admitting: Obstetrics and Gynecology

## 2024-07-01 ENCOUNTER — Other Ambulatory Visit (HOSPITAL_COMMUNITY)
Admission: RE | Admit: 2024-07-01 | Discharge: 2024-07-01 | Disposition: A | Discharge status: Home / Self Care | Source: Ambulatory Visit | Attending: Obstetrics and Gynecology | Admitting: Obstetrics and Gynecology

## 2024-07-01 ENCOUNTER — Encounter: Payer: Self-pay | Admitting: Obstetrics and Gynecology

## 2024-07-01 VITALS — BP 129/71 | HR 66

## 2024-07-01 DIAGNOSIS — N898 Other specified noninflammatory disorders of vagina: Secondary | ICD-10-CM | POA: Insufficient documentation

## 2024-07-01 DIAGNOSIS — R82998 Other abnormal findings in urine: Secondary | ICD-10-CM

## 2024-07-01 DIAGNOSIS — N813 Complete uterovaginal prolapse: Secondary | ICD-10-CM | POA: Diagnosis not present

## 2024-07-01 DIAGNOSIS — R220 Localized swelling, mass and lump, head: Secondary | ICD-10-CM | POA: Diagnosis not present

## 2024-07-01 LAB — POCT URINALYSIS DIP (CLINITEK)
Bilirubin, UA: NEGATIVE
Glucose, UA: NEGATIVE mg/dL
Ketones, POC UA: NEGATIVE mg/dL
Nitrite, UA: NEGATIVE
POC PROTEIN,UA: 30 — AB
Spec Grav, UA: 1.01 (ref 1.010–1.025)
Urobilinogen, UA: 0.2 U/dL
pH, UA: 5.5 (ref 5.0–8.0)

## 2024-07-01 LAB — URINALYSIS, ROUTINE W REFLEX MICROSCOPIC
Bilirubin Urine: NEGATIVE
Glucose, UA: NEGATIVE mg/dL
Ketones, ur: NEGATIVE mg/dL
Nitrite: NEGATIVE
Protein, ur: 30 mg/dL — AB
Specific Gravity, Urine: 1.006 (ref 1.005–1.030)
WBC, UA: >50 WBC/hpf (ref 0–5)
pH: 6 (ref 5.0–8.0)

## 2024-07-01 MED ORDER — PREDNISONE 20 MG PO TABS: 20.0000 mg | ORAL_TABLET | Freq: Every day | ORAL | 0 refills | Status: DC

## 2024-07-01 NOTE — Patient Instructions (Signed)
 Please take Pepcid and the prednisone .   Wait for swab to return before restarting estrogen cream

## 2024-07-01 NOTE — Progress Notes (Signed)
 [facility]   Subjective:     Chief Complaint: Pessary fitting Ariana Dillon is a 78 y.o. female is here for pessary fitting)  History of Present Illness: Ariana Dillon is a 78 y.o. female with stage IV pelvic organ prolapse who presents today for a pessary fitting.   Has previously had a dislodged #5 Gellhorn pessary and her #5 Cube pessary was dislodged last week while walking.   Past Medical History: Patient  has a past medical history of Allergy (10 years ago), Anemia, Colon polyps, Diverticulosis, Heart murmur, Hyperlipidemia, and Hypertension.   Past Surgical History: She  has a past surgical history that includes Breast cyst aspiration (Right).   Medications: She has a current medication list which includes the following prescription(s): estradiol , ferrous sulfate , lisinopril -hydrochlorothiazide , omega 3, and prednisone .   Allergies: Patient is allergic to lipitor [atorvastatin], penicillins, and sulfa antibiotics.   Social History: Patient  reports that she has never smoked. She has never used smokeless tobacco. She reports that she does not drink alcohol  and does not use drugs.      Objective:    BP 129/71   Pulse 66  Gen: No apparent distress, A&O x 3. Patient has swelling to her lips. She is speaking in full sentences and has no breathing difficulties.  Pelvic Exam: Normal external female genitalia; Bartholin's and Skene's glands normal in appearance; urethral meatus normal in appearance, no urethral masses or discharge.   A size #7 long stem gellhorn pessary was fitted. It was comfortable, stayed in place with valsalva and was an appropriate size on examination, with one finger fitting between the pessary and the vaginal walls.   Lab Results  Component Value Date   COLORU yellow 07/01/2024   CLARITYU cloudy (A) 07/01/2024   GLUCOSEUR negative 07/01/2024   BILIRUBINUR negative 07/01/2024   KETONESU neg 10/27/2014   SPECGRAV 1.010 07/01/2024   RBCUR  moderate (A) 07/01/2024   PHUR 5.5 07/01/2024   PROTEINUR 30 (A) 05/17/2024   UROBILINOGEN 0.2 07/01/2024   LEUKOCYTESUR Large (3+) (A) 07/01/2024     Assessment/Plan:    Assessment: Ms. Ariana Dillon is a 78 y.o. with stage IV pelvic organ prolapse who presents for a pessary fitting. Plan: She was fitted with a #7 long stem gellhorn pessary (Lot [id number]). She will keep the pessary in place until next visit. She will use estrogen.   Patinet also reports some irritation. Vaginal swab done to rule out yeast/BV, and urine preformed to rule out UTI. Will send urine for culture.   Patient has taken a benadryl for her facial/lip swelling. She believes she ate something that caused the reaction. We discussed taking pepcid and I prescribed a short course of prednisone  to help with the swelling. Strict ER precautions advised for trouble breathing, swallowing, or worsening facial swelling.   Follow-up in 4 weeks for a pessary check or sooner as needed.  All questions were answered.    Antonyo Hinderer G Denim Kalmbach, NP

## 2024-07-02 ENCOUNTER — Ambulatory Visit: Payer: Self-pay | Admitting: Obstetrics and Gynecology

## 2024-07-02 DIAGNOSIS — R82998 Other abnormal findings in urine: Secondary | ICD-10-CM

## 2024-07-02 DIAGNOSIS — R350 Frequency of micturition: Secondary | ICD-10-CM

## 2024-07-02 LAB — CERVICOVAGINAL ANCILLARY ONLY
Bacterial Vaginitis (gardnerella): NEGATIVE
Candida Glabrata: NEGATIVE
Candida Vaginitis: NEGATIVE
Comment: NEGATIVE
Comment: NEGATIVE
Comment: NEGATIVE

## 2024-07-03 LAB — URINE CULTURE: Culture: >=100000 — AB

## 2024-07-03 MED ORDER — NITROFURANTOIN MONOHYD MACRO 100 MG PO CAPS: 100.0000 mg | ORAL_CAPSULE | Freq: Two times a day (BID) | ORAL | 0 refills | Status: AC

## 2024-07-15 ENCOUNTER — Ambulatory Visit: Admitting: Obstetrics and Gynecology

## 2024-07-29 DIAGNOSIS — I10 Essential (primary) hypertension: Secondary | ICD-10-CM | POA: Diagnosis not present

## 2024-07-29 DIAGNOSIS — H2513 Age-related nuclear cataract, bilateral: Secondary | ICD-10-CM | POA: Diagnosis not present

## 2024-07-29 DIAGNOSIS — H401131 Primary open-angle glaucoma, bilateral, mild stage: Secondary | ICD-10-CM | POA: Diagnosis not present

## 2024-08-12 ENCOUNTER — Ambulatory Visit: Admitting: Obstetrics and Gynecology

## 2024-08-12 ENCOUNTER — Encounter: Payer: Self-pay | Admitting: Obstetrics and Gynecology

## 2024-08-12 VITALS — BP 146/84 | HR 83

## 2024-08-12 DIAGNOSIS — Z960 Presence of urogenital implants: Secondary | ICD-10-CM | POA: Diagnosis not present

## 2024-08-12 DIAGNOSIS — N813 Complete uterovaginal prolapse: Secondary | ICD-10-CM | POA: Diagnosis not present

## 2024-08-12 NOTE — Progress Notes (Signed)
 [facility]   Subjective:     Chief Complaint:  Chief Complaint  Patient presents with   Follow-up    Ariana Dillon is a 78 y.o. female here today for pessary check.   History of Present Illness: Ariana Dillon is a 78 y.o. female with stage IV pelvic organ prolapse who presents for a pessary check. She is using a size #7 long stem gellhorn pessary. The pessary has been working well and she has no complaints. She is using vaginal estrogen. She denies vaginal bleeding.  Past Medical History: Patient  has a past medical history of Allergy (10 years ago), Anemia, Colon polyps, Diverticulosis, Glaucoma, Heart murmur, Hyperlipidemia, and Hypertension.   Past Surgical History: She  has a past surgical history that includes Breast cyst aspiration (Right).   Medications: She has a current medication list which includes the following prescription(s): estradiol , ferrous sulfate , lisinopril -hydrochlorothiazide , omega 3, and prednisone .   Allergies: Patient is allergic to lipitor [atorvastatin], penicillins, and sulfa antibiotics.   Social History: Patient  reports that she has never smoked. She has never used smokeless tobacco. She reports that she does not drink alcohol  and does not use drugs.      Objective:    Physical Exam: BP (!) 146/84   Pulse 83  Gen: No apparent distress, A&O x 3. Detailed Urogynecologic Evaluation:  Pelvic Exam: Normal external female genitalia; Bartholin's and Skene's glands normal in appearance; urethral meatus normal in appearance, no urethral masses or discharge. The pessary was noted to be in place. It was removed and cleaned. Speculum exam revealed no lesions in the vagina. The pessary was replaced. It was comfortable to the patient and fit well.   Assessment/Plan:    Assessment: Ariana Dillon is a 78 y.o. with stage IV pelvic organ prolapse here for a pessary check. She is doing well.  Plan: She will keep the pessary in place until next visit.  She will continue to use estrogen. She will follow-up in 3 months for a pessary check or sooner as needed.  All questions were answered.   Time Spent:

## 2024-09-06 DIAGNOSIS — H401121 Primary open-angle glaucoma, left eye, mild stage: Secondary | ICD-10-CM | POA: Diagnosis not present

## 2024-10-14 ENCOUNTER — Inpatient Hospital Stay
Admission: RE | Admit: 2024-10-14 | Discharge: 2024-10-14 | Attending: Nurse Practitioner | Admitting: Nurse Practitioner

## 2024-10-14 DIAGNOSIS — Z1231 Encounter for screening mammogram for malignant neoplasm of breast: Secondary | ICD-10-CM

## 2024-10-18 NOTE — Progress Notes (Signed)
 Inis Borneman                                          MRN: [id number]   10/18/2024   The [facility] Quality Team Specialist reviewed this patient medical record for the purposes of chart review for care gap closure. The following were reviewed: chart review for care gap closure-controlling blood pressure.    [facility] Quality Team

## 2024-11-04 ENCOUNTER — Encounter: Payer: Self-pay | Admitting: *Deleted

## 2024-11-07 ENCOUNTER — Encounter: Payer: Self-pay | Admitting: Nurse Practitioner

## 2024-11-07 ENCOUNTER — Ambulatory Visit: Payer: Self-pay | Admitting: Nurse Practitioner

## 2024-11-07 VITALS — BP 158/74 | HR 68 | Temp 97.4°F | Ht 64.0 in | Wt 173.0 lb

## 2024-11-07 DIAGNOSIS — N184 Chronic kidney disease, stage 4 (severe): Secondary | ICD-10-CM

## 2024-11-07 DIAGNOSIS — I10 Essential (primary) hypertension: Secondary | ICD-10-CM

## 2024-11-07 DIAGNOSIS — Z6829 Body mass index (BMI) 29.0-29.9, adult: Secondary | ICD-10-CM

## 2024-11-07 DIAGNOSIS — D631 Anemia in chronic kidney disease: Secondary | ICD-10-CM

## 2024-11-07 DIAGNOSIS — E782 Mixed hyperlipidemia: Secondary | ICD-10-CM

## 2024-11-07 DIAGNOSIS — R600 Localized edema: Secondary | ICD-10-CM

## 2024-11-07 LAB — LIPID PANEL

## 2024-11-07 MED ORDER — FERROUS SULFATE 325 (65 FE) MG PO TBEC: 325.0000 mg | DELAYED_RELEASE_TABLET | Freq: Every day | ORAL | 1 refills | Status: AC

## 2024-11-07 MED ORDER — LISINOPRIL-HYDROCHLOROTHIAZIDE 20-25 MG PO TABS: 1.0000 | ORAL_TABLET | Freq: Every day | ORAL | 1 refills | Status: AC

## 2024-11-07 NOTE — Progress Notes (Signed)
 "  Subjective:    Patient ID: Ariana Dillon, female    DOB: Oct 02, 1946, 79 y.o.   MRN: [id number]   Chief Complaint: medical management of chronic issues     HPI:  Ariana Dillon is a 79 y.o. who identifies as a female who was assigned female at birth.   Social history: Lives with: husband Work history: retired   Water Engineer in today for follow up of the following chronic medical issues:  1. ESSENTIAL HYPERTENSION, BENIGN No c/o chest pain, sob or headache. Does not check blood pressure at home. We changed patient to lasix  at last visit but she says that made her feel bad so she went back on lisinopril /hydrochlorothiazide  and is doing better BP Readings from Last 3 Encounters:  08/12/24 (!) 146/84  07/01/24 129/71  06/17/24 (!) 149/69     2. MIXED HYPERLIPIDEMIA Does watch diet but doe snot do much exercise. Refuses statin therapy. Lab Results  Component Value Date   CHOL 185 05/10/2024   HDL 40 05/10/2024   LDLCALC 119 (H) 05/10/2024   TRIG 143 05/10/2024   CHOLHDL 4.6 (H) 05/10/2024   The 10-year ASCVD risk score (Arnett DK, et al., 2019) is: 34%   3. Anemia due  to chronic kidney disease Is on daily iron supplement. Denies constipation. Lab Results  Component Value Date   HGB 10.8 (L) 06/03/2024   Last egfr was 32  4. Peripheral edema Has daily. Wears compression socks daily which helps   5. BMI 29.0-29.9,adult No recent weight changes Wt Readings from Last 3 Encounters:  11/07/24 173 lb (78.5 kg)  06/21/24 170 lb (77.1 kg)  06/10/24 170 lb (77.1 kg)   BMI Readings from Last 3 Encounters:  11/07/24 29.70 kg/m  06/21/24 29.18 kg/m  06/10/24 29.18 kg/m         New complaints: None today  Allergies  Allergen Reactions   Lipitor [Atorvastatin] Other (See Comments)    Myalgia    Penicillins    Sulfa Antibiotics     Itching    Outpatient Encounter Medications as of 11/07/2024  Medication Sig   estradiol  (ESTRACE ) 0.1 MG/GM vaginal cream Place  0.5g nightly for two weeks then twice a week after   ferrous sulfate  325 (65 FE) MG EC tablet Take 1 tablet (325 mg total) by mouth daily.   lisinopril -hydrochlorothiazide  (ZESTORETIC ) 20-12.5 MG tablet Take 1 tablet by mouth daily.   Omega 3 1000 MG CAPS TAKE 2 CAPSULES (2,000 MG TOTAL) BY MOUTH DAILY.   predniSONE  (DELTASONE ) 20 MG tablet Take 1 tablet (20 mg total) by mouth daily with breakfast.   No facility-administered encounter medications on file as of 11/07/2024.    Past Surgical History:  Procedure Laterality Date   BREAST CYST ASPIRATION Right     Family History  Problem Relation Age of Onset   Diabetes Mother    Alzheimer's disease Mother    Miscarriages / Stillbirths Mother    Cancer Father        lung   Polymyositis Sister    Heart disease Brother    Heart disease Brother    Heart disease Brother    Breast cancer Maternal Aunt    Heart disease Brother    Colon cancer Neg Hx    Colon polyps Neg Hx    Esophageal cancer Neg Hx    Rectal cancer Neg Hx    Stomach cancer Neg Hx       Controlled substance contract: n/a  Review of Systems  Constitutional:  Negative for diaphoresis.  Eyes:  Negative for pain.  Respiratory:  Negative for shortness of breath.   Cardiovascular:  Negative for chest pain, palpitations and leg swelling.  Gastrointestinal:  Negative for abdominal pain.  Endocrine: Negative for polydipsia.  Skin:  Negative for rash.  Neurological:  Negative for dizziness, weakness and headaches.  Hematological:  Does not bruise/bleed easily.  All other systems reviewed and are negative.      Objective:   Physical Exam Vitals and nursing note reviewed.  Constitutional:      General: She is not in acute distress.    Appearance: Normal appearance. She is well-developed.  HENT:     Head: Normocephalic.     Right Ear: Tympanic membrane normal.     Left Ear: Tympanic membrane normal.     Nose: Nose normal.     Mouth/Throat:     Mouth:  Mucous membranes are moist.  Eyes:     Pupils: Pupils are equal, round, and reactive to light.  Neck:     Vascular: No carotid bruit or JVD.  Cardiovascular:     Rate and Rhythm: Normal rate and regular rhythm.     Heart sounds: Murmur (2/6) heard.  Pulmonary:     Effort: Pulmonary effort is normal. No respiratory distress.     Breath sounds: Normal breath sounds. No wheezing or rales.  Chest:     Chest wall: No tenderness.  Abdominal:     General: Bowel sounds are normal. There is no distension or abdominal bruit.     Palpations: Abdomen is soft. There is no hepatomegaly, splenomegaly, mass or pulsatile mass.     Tenderness: There is no abdominal tenderness.  Musculoskeletal:        General: Normal range of motion.     Cervical back: Normal range of motion and neck supple.     Right lower leg: Edema (1+) present.     Left lower leg: Edema (1+) present.  Lymphadenopathy:     Cervical: No cervical adenopathy.  Skin:    General: Skin is warm and dry.  Neurological:     Mental Status: She is alert and oriented to person, place, and time.     Deep Tendon Reflexes: Reflexes are normal and symmetric.  Psychiatric:        Behavior: Behavior normal.        Thought Content: Thought content normal.        Judgment: Judgment normal.    BP (!) 158/74   Pulse 68   Temp (!) 97.4 F (36.3 C) (Temporal)   Ht 5' 4 (1.626 m)   Wt 173 lb (78.5 kg)   SpO2 96%   BMI 29.70 kg/m         Assessment & Plan:   Ariana Dillon comes in today with chief complaint of medical management of chronic issues    Diagnosis and orders addressed:  1. ESSENTIAL HYPERTENSION, BENIGN (Primary) Low sodium diet - lisinopril -hydrochlorothiazide  (ZESTORETIC ) 20-12.5 MG tablet; Take 1 tablet by mouth daily.  Dispense: 90 tablet; Refill: 1 - CBC with Differential/Platelet - CMP14+EGFR  2. MIXED HYPERLIPIDEMIA Low fat diet - Lipid panel  3. BMI 29.0-29.9,adult Discussed diet and exercise for person  with BMI >25 Will recheck weight in 3-6 months   4. Peripheral edema Compression socks Elevate legs when sitting   Labs pending Health Maintenance reviewed Diet and exercise encouraged  Follow up plan: 6 months   Mary-Margaret Gladis, FNP  "

## 2024-11-07 NOTE — Patient Instructions (Signed)
 Peripheral Edema  Peripheral edema is swelling that is caused by a buildup of fluid. Peripheral edema most often affects the lower legs, ankles, and feet. It can also develop in the arms, hands, and face. The area of the body that has peripheral edema will look swollen. It may also feel heavy or warm. Your clothes may start to feel tight. Pressing on the area may make a temporary dent in your skin (pitting edema). You may not be able to move your swollen arm or leg as much as usual. There are many causes of peripheral edema. It can happen because of a complication of other conditions such as heart failure, kidney disease, or a problem with your circulation. It also can be a side effect of certain medicines or happen because of an infection. It often happens to women during pregnancy. Sometimes, the cause is not known. Follow these instructions at home: Managing pain, stiffness, and swelling  Raise (elevate) your legs while you are sitting or lying down. Move around often to prevent stiffness and to reduce swelling. Do not sit or stand for long periods of time. Do not wear tight clothing. Do not wear garters on your upper legs. Exercise your legs to get your circulation going. This helps to move the fluid back into your blood vessels, and it may help the swelling go down. Wear compression stockings as told by your health care provider. These stockings help to prevent blood clots and reduce swelling in your legs. It is important that these are the correct size. These stockings should be prescribed by your doctor to prevent possible injuries. If elastic bandages or wraps are recommended, use them as told by your health care provider. Medicines Take over-the-counter and prescription medicines only as told by your health care provider. Your health care provider may prescribe medicine to help your body get rid of excess water (diuretic). Take this medicine if you are told to take it. General  instructions Eat a low-salt (low-sodium) diet as told by your health care provider. Sometimes, eating less salt may reduce swelling. Pay attention to any changes in your symptoms. Moisturize your skin daily to help prevent skin from cracking and draining. Keep all follow-up visits. This is important. Contact a health care provider if: You have a fever. You have swelling in only one leg. You have increased swelling, redness, or pain in one or both of your legs. You have drainage or sores at the area where you have edema. Get help right away if: You have edema that starts suddenly or is getting worse, especially if you are pregnant or have a medical condition. You develop shortness of breath, especially when you are lying down. You have pain in your chest or abdomen. You feel weak. You feel like you will faint. These symptoms may be an emergency. Get help right away. Call 911. Do not wait to see if the symptoms will go away. Do not drive yourself to the hospital. Summary Peripheral edema is swelling that is caused by a buildup of fluid. Peripheral edema most often affects the lower legs, ankles, and feet. Move around often to prevent stiffness and to reduce swelling. Do not sit or stand for long periods of time. Pay attention to any changes in your symptoms. Contact a health care provider if you have edema that starts suddenly or is getting worse, especially if you are pregnant or have a medical condition. Get help right away if you develop shortness of breath, especially when lying down.  This information is not intended to replace advice given to you by your health care provider. Make sure you discuss any questions you have with your health care provider. Document Revised: 06/14/2021 Document Reviewed: 06/14/2021 Elsevier Patient Education  2024 [employer].

## 2024-11-07 NOTE — Addendum Note (Signed)
 Addended by: Meagen Limones, MARY-MARGARET on: 11/07/2024 02:25 PM   Modules accepted: Orders

## 2024-11-08 ENCOUNTER — Ambulatory Visit: Payer: Self-pay | Admitting: Nurse Practitioner

## 2024-11-08 DIAGNOSIS — E782 Mixed hyperlipidemia: Secondary | ICD-10-CM

## 2024-11-08 LAB — CBC WITH DIFFERENTIAL/PLATELET
Basophils Absolute: 0.1 x10E3/uL (ref 0.0–0.2)
Basos: 1 %
EOS (ABSOLUTE): 0.1 x10E3/uL (ref 0.0–0.4)
Eos: 2 %
Hematocrit: 38 % (ref 34.0–46.6)
Hemoglobin: 12.9 g/dL (ref 11.1–15.9)
Immature Grans (Abs): 0.1 x10E3/uL (ref 0.0–0.1)
Immature Granulocytes: 1 %
Lymphocytes Absolute: 2 x10E3/uL (ref 0.7–3.1)
Lymphs: 28 %
MCH: 31.5 pg (ref 26.6–33.0)
MCHC: 33.9 g/dL (ref 31.5–35.7)
MCV: 93 fL (ref 79–97)
Monocytes Absolute: 0.7 x10E3/uL (ref 0.1–0.9)
Monocytes: 9 %
Neutrophils Absolute: 4.4 x10E3/uL (ref 1.4–7.0)
Neutrophils: 59 %
Platelets: 181 x10E3/uL (ref 150–450)
RBC: 4.09 x10E6/uL (ref 3.77–5.28)
RDW: 12.1 % (ref 11.7–15.4)
WBC: 7.3 x10E3/uL (ref 3.4–10.8)

## 2024-11-08 LAB — CMP14+EGFR
ALT: 11 IU/L (ref 0–32)
AST: 16 IU/L (ref 0–40)
Albumin: 4.4 g/dL (ref 3.8–4.8)
Alkaline Phosphatase: 100 IU/L (ref 49–135)
BUN/Creatinine Ratio: 20 (ref 12–28)
BUN: 27 mg/dL (ref 8–27)
Bilirubin Total: <0.2 mg/dL (ref 0.0–1.2)
CO2: 23 mmol/L (ref 20–29)
Calcium: 10 mg/dL (ref 8.7–10.3)
Chloride: 103 mmol/L (ref 96–106)
Creatinine, Ser: 1.33 mg/dL — AB (ref 0.57–1.00)
Globulin, Total: 2.3 g/dL (ref 1.5–4.5)
Glucose: 99 mg/dL (ref 70–99)
Potassium: 4.5 mmol/L (ref 3.5–5.2)
Sodium: 140 mmol/L (ref 134–144)
Total Protein: 6.7 g/dL (ref 6.0–8.5)
eGFR: 41 mL/min/1.73 — AB

## 2024-11-08 LAB — LIPID PANEL
Cholesterol, Total: 197 mg/dL (ref 100–199)
HDL: 46 mg/dL
LDL CALC COMMENT:: 4.3 ratio (ref 0.0–4.4)
LDL Chol Calc (NIH): 112 mg/dL — AB (ref 0–99)
Triglycerides: 223 mg/dL — AB (ref 0–149)
VLDL Cholesterol Cal: 39 mg/dL (ref 5–40)

## 2024-11-11 ENCOUNTER — Ambulatory Visit: Admitting: Obstetrics and Gynecology

## 2024-11-11 ENCOUNTER — Encounter: Payer: Self-pay | Admitting: *Deleted

## 2024-11-12 ENCOUNTER — Telehealth: Payer: Self-pay

## 2024-11-12 NOTE — Progress Notes (Signed)
 Care Guide Pharmacy Note  11/12/2024 Name: Ariana Dillon MRN: [id number] DOB: [birth year]  Referred By: Gladis Mustard, FNP Reason for referral: Complex Care Management (Outreach to schedule with Pharm d)   Ariana Dillon is a 79 y.o. year old female who is a primary care patient of Gladis Mustard, FNP.  Ariana Dillon was referred to the pharmacist for assistance related to: HLD  Successful contact was made with the patient to discuss pharmacy services including being ready for the pharmacist to call at least 5 minutes before the scheduled appointment time and to have medication bottles and any blood pressure readings ready for review. The patient agreed to meet with the pharmacist via telephone visit on (date/time).12/12/2024  Jeoffrey Buffalo , RMA     [place]  [facility], [facility] Guide  Direct Dial: [phone]  Website: [email or web address]

## 2024-11-12 NOTE — Progress Notes (Signed)
 Care Guide Pharmacy Note  11/12/2024 Name: Sanyiah Kanzler MRN: [id number] DOB: May 31, 1946  Referred By: Gladis Mustard, FNP Reason for referral: Complex Care Management (Outreach to schedule with Pharm d)   Sheilla Maris is a 79 y.o. year old female who is a primary care patient of Gladis Mustard, FNP.  Lysandra Loughmiller was referred to the pharmacist for assistance related to: HLD  An unsuccessful telephone outreach was attempted today to contact the patient who was referred to the pharmacy team for assistance with medication management. Additional attempts will be made to contact the patient.  Jeoffrey Buffalo , RMA     [facility]  [facility], [facility] Guide  Direct Dial: [phone]  Website: [email or web address]

## 2024-11-18 ENCOUNTER — Ambulatory Visit: Admitting: Obstetrics and Gynecology

## 2024-12-02 ENCOUNTER — Ambulatory Visit: Admitting: Obstetrics and Gynecology

## 2024-12-12 ENCOUNTER — Other Ambulatory Visit

## 2024-12-27 ENCOUNTER — Ambulatory Visit: Admitting: Obstetrics and Gynecology

## 2025-05-08 ENCOUNTER — Ambulatory Visit: Admitting: Nurse Practitioner

## 2025-06-24 ENCOUNTER — Ambulatory Visit: Payer: Self-pay
